# Patient Record
Sex: Female | Born: 1990 | Race: White | Hispanic: No | Marital: Married | Smoking: Former smoker
Health system: Southern US, Community
[De-identification: ages and names within clinical notes are randomized; demographics above are authoritative.]

## PROBLEM LIST (undated history)

## (undated) DIAGNOSIS — L309 Dermatitis, unspecified: Secondary | ICD-10-CM

## (undated) DIAGNOSIS — D509 Iron deficiency anemia, unspecified: Secondary | ICD-10-CM

## (undated) DIAGNOSIS — D249 Benign neoplasm of unspecified breast: Secondary | ICD-10-CM

## (undated) DIAGNOSIS — C539 Malignant neoplasm of cervix uteri, unspecified: Secondary | ICD-10-CM

## (undated) DIAGNOSIS — R002 Palpitations: Secondary | ICD-10-CM

## (undated) DIAGNOSIS — R079 Chest pain, unspecified: Secondary | ICD-10-CM

## (undated) DIAGNOSIS — Z8541 Personal history of malignant neoplasm of cervix uteri: Secondary | ICD-10-CM

## (undated) DIAGNOSIS — R1310 Dysphagia, unspecified: Secondary | ICD-10-CM

## (undated) DIAGNOSIS — N857 Hematometra: Secondary | ICD-10-CM

## (undated) DIAGNOSIS — G44209 Tension-type headache, unspecified, not intractable: Secondary | ICD-10-CM

## (undated) HISTORY — DX: Malignant neoplasm of cervix uteri, unspecified: C53.9

## (undated) HISTORY — PX: CERVICAL BIOPSY  W/ LOOP ELECTRODE EXCISION: SUR135

## (undated) HISTORY — DX: Tension-type headache, unspecified, not intractable: G44.209

## (undated) HISTORY — PX: CERVICAL CONIZATION W/BX: SHX1330

## (undated) HISTORY — DX: Benign neoplasm of unspecified breast: D24.9

## (undated) HISTORY — DX: Chest pain, unspecified: R07.9

## (undated) HISTORY — DX: Dysphagia, unspecified: R13.10

---

## 2011-02-07 HISTORY — PX: BREAST BIOPSY: SHX20

## 2018-03-20 DIAGNOSIS — R87613 High grade squamous intraepithelial lesion on cytologic smear of cervix (HGSIL): Secondary | ICD-10-CM | POA: Insufficient documentation

## 2019-02-13 DIAGNOSIS — Z87891 Personal history of nicotine dependence: Secondary | ICD-10-CM | POA: Insufficient documentation

## 2019-03-05 DIAGNOSIS — C539 Malignant neoplasm of cervix uteri, unspecified: Secondary | ICD-10-CM | POA: Insufficient documentation

## 2019-06-04 ENCOUNTER — Other Ambulatory Visit: Payer: Self-pay

## 2019-06-04 ENCOUNTER — Emergency Department (HOSPITAL_COMMUNITY)
Admission: EM | Admit: 2019-06-04 | Discharge: 2019-06-05 | Disposition: A | Discharge status: Home / Self Care | Payer: 59 | Attending: Emergency Medicine | Admitting: Emergency Medicine

## 2019-06-04 ENCOUNTER — Encounter (HOSPITAL_COMMUNITY): Payer: Self-pay | Admitting: Emergency Medicine

## 2019-06-04 DIAGNOSIS — Z9889 Other specified postprocedural states: Secondary | ICD-10-CM | POA: Insufficient documentation

## 2019-06-04 DIAGNOSIS — N939 Abnormal uterine and vaginal bleeding, unspecified: Secondary | ICD-10-CM | POA: Insufficient documentation

## 2019-06-04 DIAGNOSIS — Z79899 Other long term (current) drug therapy: Secondary | ICD-10-CM | POA: Insufficient documentation

## 2019-06-04 LAB — I-STAT CHEM 8, ED
BUN: 7 mg/dL (ref 6–20)
Calcium, Ion: 1.27 mmol/L (ref 1.15–1.40)
Chloride: 103 mmol/L (ref 98–111)
Creatinine, Ser: 0.6 mg/dL (ref 0.44–1.00)
Glucose, Bld: 78 mg/dL (ref 70–99)
HCT: 30 % — ABNORMAL LOW (ref 36.0–46.0)
Hemoglobin: 10.2 g/dL — ABNORMAL LOW (ref 12.0–15.0)
Potassium: 4 mmol/L (ref 3.5–5.1)
Sodium: 138 mmol/L (ref 135–145)
TCO2: 24 mmol/L (ref 22–32)

## 2019-06-04 LAB — CBC WITH DIFFERENTIAL/PLATELET
Abs Immature Granulocytes: 0.03 10*3/uL (ref 0.00–0.07)
Basophils Absolute: 0.1 10*3/uL (ref 0.0–0.1)
Basophils Relative: 1 %
Eosinophils Absolute: 0 10*3/uL (ref 0.0–0.5)
Eosinophils Relative: 1 %
HCT: 31 % — ABNORMAL LOW (ref 36.0–46.0)
Hemoglobin: 10.2 g/dL — ABNORMAL LOW (ref 12.0–15.0)
Immature Granulocytes: 0 %
Lymphocytes Relative: 15 %
Lymphs Abs: 1.3 10*3/uL (ref 0.7–4.0)
MCH: 29.5 pg (ref 26.0–34.0)
MCHC: 32.9 g/dL (ref 30.0–36.0)
MCV: 89.6 fL (ref 80.0–100.0)
Monocytes Absolute: 0.6 10*3/uL (ref 0.1–1.0)
Monocytes Relative: 7 %
Neutro Abs: 6.4 10*3/uL (ref 1.7–7.7)
Neutrophils Relative %: 76 %
Platelets: 133 10*3/uL — ABNORMAL LOW (ref 150–400)
RBC: 3.46 MIL/uL — ABNORMAL LOW (ref 3.87–5.11)
RDW: 13.4 % (ref 11.5–15.5)
WBC: 8.5 10*3/uL (ref 4.0–10.5)
nRBC: 0 % (ref 0.0–0.2)

## 2019-06-04 NOTE — ED Triage Notes (Signed)
Pt c/o vaginal bleeding from a surgery on cervix earlier today:2pm

## 2019-06-05 ENCOUNTER — Encounter (HOSPITAL_COMMUNITY): Payer: Self-pay | Admitting: Emergency Medicine

## 2019-06-05 LAB — WET PREP, GENITAL
Clue Cells Wet Prep HPF POC: NONE SEEN
Sperm: NONE SEEN
Trich, Wet Prep: NONE SEEN
Yeast Wet Prep HPF POC: NONE SEEN

## 2019-06-05 MED ORDER — MONSELS FERRIC SUBSULFATE EX SOLN
Freq: Once | CUTANEOUS | Status: AC
  Filled 2019-06-05: qty 8

## 2019-06-05 NOTE — ED Provider Notes (Signed)
[facility] DEPT Provider Note   CSN: [id number] Arrival date & time: 06/04/19  2026     History Chief Complaint  Patient presents with  . Vaginal Bleeding    Cassandra Watts is a 29 y.o. female.  The history is provided by the patient.  Vaginal Bleeding Quality:  Clots and dark red Severity:  Moderate Onset quality:  Gradual Duration:  10 hours Timing:  Constant Progression:  Unchanged Chronicity:  New Menstrual history:  Regular Number of pads used:  6 Possible pregnancy: no   Context: spontaneously   Context: not after intercourse   Context comment:  Post cone biopsy at [place] earlier in the day.  Told to come in by on call  Worsened by:  Nothing Ineffective treatments:  None tried Associated symptoms: no abdominal pain, no back pain, no dizziness and no fever   Risk factors: no bleeding disorder        History reviewed. No pertinent past medical history.  There are no problems to display for this patient.   Past Surgical History:  Procedure Laterality Date  . SKIN BIOPSY       OB History   No obstetric history on file.     History reviewed. No pertinent family history.  Social History   Tobacco Use  . Smoking status: Not on file  Substance Use Topics  . Alcohol use: Not on file  . Drug use: Not on file    Home Medications Prior to Admission medications   Medication Sig Start Date End Date Taking? Authorizing Provider  acetaminophen (TYLENOL) 325 MG tablet Take 650 mg by mouth every 6 (six) hours as needed for mild pain.   Yes [provider]  ferrous sulfate 325 (65 FE) MG tablet Take 325 mg by mouth daily with breakfast.    Yes [provider]  ibuprofen (ADVIL) 200 MG tablet Take 600-800 mg by mouth every 6 (six) hours as needed for moderate pain.   Yes [provider]    Allergies    Oxycodone and Penicillins  Review of Systems   Review of Systems  Constitutional: Negative for  fever.  HENT: Negative for congestion.   Eyes: Negative for visual disturbance.  Respiratory: Negative for cough.   Gastrointestinal: Negative for abdominal pain.  Genitourinary: Positive for vaginal bleeding.  Musculoskeletal: Negative for back pain.  Neurological: Negative for dizziness.  Psychiatric/Behavioral: Negative for agitation.  All other systems reviewed and are negative.   Physical Exam Updated Vital Signs BP 114/72 (BP Location: Right Arm)   Pulse 80   Temp 98.8 F (37.1 C) (Oral)   Resp 17   Ht 5\' 6"  (1.676 m)   Wt 63.5 kg   SpO2 98%   BMI 22.60 kg/m   Physical Exam Vitals and nursing note reviewed.  Constitutional:      Appearance: Normal appearance.  HENT:     Head: Normocephalic and atraumatic.     Nose: Nose normal.  Eyes:     Conjunctiva/sclera: Conjunctivae normal.     Pupils: Pupils are equal, round, and reactive to light.  Cardiovascular:     Rate and Rhythm: Normal rate and regular rhythm.     Pulses: Normal pulses.     Heart sounds: Normal heart sounds.  Pulmonary:     Effort: Pulmonary effort is normal.     Breath sounds: Normal breath sounds.  Abdominal:     General: Abdomen is flat. Bowel sounds are normal.     Tenderness:  There is no abdominal tenderness. There is no guarding or rebound.  Genitourinary:    General: Normal vulva.     Comments: Clots and scant bleeding in the introitus on exam, chaperone present  Musculoskeletal:        General: Normal range of motion.     Cervical back: Normal range of motion and neck supple.  Skin:    General: Skin is warm and dry.     Capillary Refill: Capillary refill takes less than 2 seconds.  Neurological:     General: No focal deficit present.     Mental Status: She is alert and oriented to person, place, and time.  Psychiatric:        Mood and Affect: Mood normal.        Behavior: Behavior normal.     ED Results / Procedures / Treatments   Labs (all labs ordered are listed, but only  abnormal results are displayed) Results for orders placed or performed during the hospital encounter of 06/04/19  Wet prep, genital  Result Value Ref Range   Yeast Wet Prep HPF POC NONE SEEN NONE SEEN   Trich, Wet Prep NONE SEEN NONE SEEN   Clue Cells Wet Prep HPF POC NONE SEEN NONE SEEN   WBC, Wet Prep HPF POC MODERATE (A) NONE SEEN   Sperm NONE SEEN   CBC with Differential/Platelet  Result Value Ref Range   WBC 8.5 4.0 - 10.5 K/uL   RBC 3.46 (L) 3.87 - 5.11 MIL/uL   Hemoglobin 10.2 (L) 12.0 - 15.0 g/dL   HCT 31.0 (L) 36.0 - 46.0 %   MCV 89.6 80.0 - 100.0 fL   MCH 29.5 26.0 - 34.0 pg   MCHC 32.9 30.0 - 36.0 g/dL   RDW 13.4 11.5 - 15.5 %   Platelets 133 (L) 150 - 400 K/uL   nRBC 0.0 0.0 - 0.2 %   Neutrophils Relative % 76 %   Neutro Abs 6.4 1.7 - 7.7 K/uL   Lymphocytes Relative 15 %   Lymphs Abs 1.3 0.7 - 4.0 K/uL   Monocytes Relative 7 %   Monocytes Absolute 0.6 0.1 - 1.0 K/uL   Eosinophils Relative 1 %   Eosinophils Absolute 0.0 0.0 - 0.5 K/uL   Basophils Relative 1 %   Basophils Absolute 0.1 0.0 - 0.1 K/uL   Immature Granulocytes 0 %   Abs Immature Granulocytes 0.03 0.00 - 0.07 K/uL  I-stat chem 8, ED (not at [facility] or [facility])  Result Value Ref Range   Sodium 138 135 - 145 mmol/L   Potassium 4.0 3.5 - 5.1 mmol/L   Chloride 103 98 - 111 mmol/L   BUN 7 6 - 20 mg/dL   Creatinine, Ser 0.60 0.44 - 1.00 mg/dL   Glucose, Bld 78 70 - 99 mg/dL   Calcium, Ion 1.27 1.15 - 1.40 mmol/L   TCO2 24 22 - 32 mmol/L   Hemoglobin 10.2 (L) 12.0 - 15.0 g/dL   HCT 30.0 (L) 36.0 - 46.0 %   No results found.  Radiology No results found.  Procedures Procedures (including critical care time)  Medications Ordered in ED Medications - No data to display  ED Course  I have reviewed the triage vital signs and the nursing notes.  Pertinent labs & imaging results that were available during my care of the patient were reviewed by me and considered in my medical decision making (see chart for  details).   Monsel solution obtained from pharmacy and painted  on cervix with chaperone present to stop bleeding .   Case d/w Dr. Jacky Kindle on call for Dr. Marlaine Hind patient's surgeon.  Will need to be seen there or transferred to [facility] main if cannot stop bleeding.    125 Case d/w Dr. Sherlie Ban solution to stop bleeding, wait and ambulate patient if not bleeding may be discharged and follow up tomorrow with GYN of record.      Patient ambulated without rebleeding.    Patient informed of coffee ground discharge following mosel solution and need to follow up today with her GYN.  Patient verbalizes understanding and agrees to follow up.  Final Clinical Impression(s) / ED Diagnoses Return for weakness, numbness, changes in vision or speech, fevers >100.4 unrelieved by medication, shortness of breath, intractable vomiting, or diarrhea, abdominal pain, Inability to tolerate liquids or food, cough, altered mental status or any concerns. No signs of systemic illness or infection. The patient is nontoxic-appearing on exam and vital signs are within normal limits.   I have reviewed the triage vital signs and the nursing notes. Pertinent labs &imaging results that were available during my care of the patient were reviewed by me and considered in my medical decision making (see chart for details).  After history, exam, and medical workup I feel the patient has been appropriately medically screened and is safe for discharge home. Pertinent diagnoses were discussed with the patient. Patient was givenstrictreturn precautions.   Lisabeth Mian, MD 06/05/19 [id number]

## 2019-06-05 NOTE — Discharge Instructions (Signed)
foll

## 2019-07-16 ENCOUNTER — Ambulatory Visit: Payer: No Typology Code available for payment source | Admitting: Cardiology

## 2019-07-17 ENCOUNTER — Encounter: Payer: Self-pay | Admitting: *Deleted

## 2019-07-17 ENCOUNTER — Ambulatory Visit (INDEPENDENT_AMBULATORY_CARE_PROVIDER_SITE_OTHER): Payer: No Typology Code available for payment source | Admitting: Cardiology

## 2019-07-17 ENCOUNTER — Other Ambulatory Visit: Payer: Self-pay

## 2019-07-17 VITALS — BP 125/70 | HR 61 | Temp 97.3°F | Ht 66.0 in | Wt 144.8 lb

## 2019-07-17 DIAGNOSIS — R079 Chest pain, unspecified: Secondary | ICD-10-CM

## 2019-07-17 DIAGNOSIS — R002 Palpitations: Secondary | ICD-10-CM

## 2019-07-17 NOTE — Progress Notes (Signed)
Cardiology Office Note:    Date:  07/17/2019   ID:  Cassandra Watts, DOB May 25, 1990, MRN [id number]  PCP:  Caren Macadam, MD  Cardiologist:  No primary care provider on file.  Electrophysiologist:  None   Referring MD: Caren Macadam, MD   Chief Complaint  Patient presents with  . Palpitations    History of Present Illness:    Cassandra Watts is a 29 y.o. female with a hx of cervical cancer, former tobacco use who is referred by Dr. Mannie Stabile for evaluation of palpitations.  She reports that in April she woke up in the middle of the night with palpitations.  Described as feeling that heart rate was going slow than fast.  Felt lightheaded.  Episode lasted about 30 minutes.  She went to sleep but felt very fatigued the following day.  She then had a subsequent episode the next day where he woke her up from sleep.  Since April has had 2 episodes of this.  Walks every day for 30 minutes.  Reports some lightheadedness with walking.  States that she has had chest pain since last week.  Reports left shoulder and left chest pain.  Pain has been constant.  Described as dull aching pain that is worse with deep breathing.  Denies any cough or fever.  Has not noted worsening with exertion.  She smoked 1 pack/day x 1 year, quit in 2012.  Family history includes mother had arrhythmia (unclear diagnosis).  Labs on 5/24 showed normal thyroid, creatinine, electrolytes. Hgb 11.3.   No past medical history on file.  Past Surgical History:  Procedure Laterality Date  . SKIN BIOPSY      Current Medications: Current Meds  Medication Sig  . ferrous sulfate 325 (65 FE) MG tablet Take 325 mg by mouth daily with breakfast.      Allergies:   Oxycodone and Penicillins   Social History   Socioeconomic History  . Marital status: Married    Spouse name: Not on file  . Number of children: Not on file  . Years of education: Not on file  . Highest education level: Not on file  Occupational History  . Not on file    Tobacco Use  . Smoking status: Not on file  Substance and Sexual Activity  . Alcohol use: Not on file  . Drug use: Not on file  . Sexual activity: Not on file  Other Topics Concern  . Not on file  Social History Narrative  . Not on file   Social Determinants of Health   Financial Resource Strain:   . Difficulty of Paying Living Expenses:   Food Insecurity:   . Worried About Charity fundraiser in the Last Year:   . Arboriculturist in the Last Year:   Transportation Needs:   . Film/video editor (Medical):   Marland Kitchen Lack of Transportation (Non-Medical):   Physical Activity:   . Days of Exercise per Week:   . Minutes of Exercise per Session:   Stress:   . Feeling of Stress :   Social Connections:   . Frequency of Communication with Friends and Family:   . Frequency of Social Gatherings with Friends and Family:   . Attends Religious Services:   . Active Member of Clubs or Organizations:   . Attends Archivist Meetings:   Marland Kitchen Marital Status:      Family History: Mother has history of arrhythmia (unclear diagnosis).  ROS:   Please see the history  of present illness.     All other systems reviewed and are negative.  EKGs/Labs/Other Studies Reviewed:    The following studies were reviewed today:   EKG:  EKG is ordered today.  The ekg ordered today demonstrates normal sinus rhythm, rate 61, diffuse subtle concave ST elevations  Recent Labs: 06/04/2019: BUN 7; Creatinine, Ser 0.60; Hemoglobin 10.2; Platelets 133; Potassium 4.0; Sodium 138  Recent Lipid Panel No results found for: CHOL, TRIG, HDL, CHOLHDL, VLDL, LDLCALC, LDLDIRECT  Physical Exam:    VS:  BP 125/70   Pulse 61   Temp (!) 97.3 F (36.3 C)   Ht '5\' 6"'$  (1.676 m)   Wt 144 lb 12.8 oz (65.7 kg)   SpO2 99%   BMI 23.37 kg/m     Wt Readings from Last 3 Encounters:  07/17/19 144 lb 12.8 oz (65.7 kg)  06/04/19 140 lb (63.5 kg)     GEN:  Well nourished, well developed in no acute distress HEENT:  Normal NECK: No JVD; No carotid bruits LYMPHATICS: No lymphadenopathy CARDIAC: RRR, no murmurs, rubs, gallops RESPIRATORY:  Clear to auscultation without rales, wheezing or rhonchi  ABDOMEN: Soft, non-tender, non-distended MUSCULOSKELETAL:  No edema; No deformity  SKIN: Warm and dry NEUROLOGIC:  Alert and oriented x 3 PSYCHIATRIC:  Normal affect   ASSESSMENT:    1. Chest pain of uncertain etiology   2. Palpitations    PLAN:    Palpitations: Description concerning for arrhythmia.  Will check Zio patch x14 days.  Will check TTE to rule out structural heart disease  Chest pain: Constant pain on left side of chest.  Given pleuritic component, will check chest x-ray.  Does have concave subtle ST elevations on EKG which could suggest pericarditis (versus early repolarization).  Will check ESR/CRP.  RTC in 2 months   Medication Adjustments/Labs and Tests Ordered: Current medicines are reviewed at length with the patient today.  Concerns regarding medicines are outlined above.  Orders Placed This Encounter  Procedures  . DG Chest 2 View  . C-reactive protein  . Sedimentation rate  . LONG TERM MONITOR (3-14 DAYS)  . EKG 12-Lead  . ECHOCARDIOGRAM COMPLETE   No orders of the defined types were placed in this encounter.   Patient Instructions  Medication Instructions:  Your physician recommends that you continue on your current medications as directed. Please refer to the Current Medication list given to you today.  *If you need a refill on your cardiac medications before your next appointment, please call your pharmacy*   Lab Work: CRP, ESR  If you have labs (blood work) drawn today and your tests are completely normal, you will receive your results only by: Marland Kitchen MyChart Message (if you have MyChart) OR . A paper copy in the mail If you have any lab test that is abnormal or we need to change your treatment, we will call you to review the results.   Testing/Procedures: Your  physician has requested that you have an echocardiogram. Echocardiography is a painless test that uses sound waves to create images of your heart. It provides your doctor with information about the size and shape of your heart and how well your heart's chambers and valves are working. This procedure takes approximately one hour. There are no restrictions for this procedure. This will be done at our [facility] location:  [address]  A chest x-ray takes a picture of the organs and structures inside the chest, including the heart, lungs,  and blood vessels. This test can show several things, including, whether the heart is enlarges; whether fluid is building up in the lungs; and whether pacemaker / defibrillator leads are still in place.   ZIO XT- Long Term Monitor Instructions   Your physician has requested you wear your ZIO patch monitor 14 days.   This is a single patch monitor.  [employer] supplies one patch monitor per enrollment.  Additional stickers are not available.   Please do not apply patch if you will be having a Nuclear Stress Test, Echocardiogram, Cardiac CT, MRI, or Chest Xray during the time frame you would be wearing the monitor. The patch cannot be worn during these tests.  You cannot remove and re-apply the ZIO XT patch monitor.   Your ZIO patch monitor will be sent USPS Priority mail from [facility] directly to your home address. The monitor may also be mailed to a PO BOX if home delivery is not available.   It may take 3-5 days to receive your monitor after you have been enrolled.   Once you have received you monitor, please review enclosed instructions.  Your monitor has already been registered assigning a specific monitor serial # to you.   Applying the monitor   Shave hair from upper left chest.   Hold abrader disc by orange tab.  Rub abrader in 40 strokes over left upper chest as indicated in your monitor instructions.   Clean area with 4  enclosed alcohol pads .  Use all pads to assure are is cleaned thoroughly.  Let dry.   Apply patch as indicated in monitor instructions.  Patch will be place under collarbone on left side of chest with arrow pointing upward.   Rub patch adhesive wings for 2 minutes.Remove white label marked "1".  Remove white label marked "2".  Rub patch adhesive wings for 2 additional minutes.   While looking in a mirror, press and release button in center of patch.  A small green light will flash 3-4 times .  This will be your only indicator the monitor has been turned on.     Do not shower for the first 24 hours.  You may shower after the first 24 hours.   Press button if you feel a symptom. You will hear a small click.  Record Date, Time and Symptom in the Patient Log Book.   When you are ready to remove patch, follow instructions on last 2 pages of Patient Log Book.  Stick patch monitor onto last page of Patient Log Book.   Place Patient Log Book in [place] box.  Use locking tab on box and tape box closed securely.  The [employer] has [employer] on it.  Please place in mailbox as soon as possible.  Your physician should have your test results approximately 7 days after the monitor has been mailed back to [facility].   Call [place] at [phone] if you have questions regarding your ZIO XT patch monitor.  Call them immediately if you see an orange light blinking on your monitor.   If your monitor falls off in less than 4 days contact our Monitor department at [phone].  If your monitor becomes loose or falls off after 4 days call [employer] at [phone] for suggestions on securing your monitor.      Follow-Up: At [facility], you and your health needs are our priority.  As part of our continuing mission to provide you with exceptional heart  care, we have created designated Provider Care Teams.  These Care Teams include your primary Cardiologist (physician)  and Advanced Practice Providers (APPs -  Physician Assistants and Nurse Practitioners) who all work together to provide you with the care you need, when you need it.  We recommend signing up for the patient portal called "MyChart".  Sign up information is provided on this After Visit Summary.  MyChart is used to connect with patients for Virtual Visits (Telemedicine).  Patients are able to view lab/test results, encounter notes, upcoming appointments, etc.  Non-urgent messages can be sent to your provider as well.   To learn more about what you can do with MyChart, go to [email or web address].    Your next appointment:   2 month(s)  The format for your next appointment:   In Person  Provider:   Oswaldo Milian, MD         Signed, Donato Heinz, MD  07/17/2019 5:57 PM    [place]

## 2019-07-17 NOTE — Patient Instructions (Signed)
Medication Instructions:  Your physician recommends that you continue on your current medications as directed. Please refer to the Current Medication list given to you today.  *If you need a refill on your cardiac medications before your next appointment, please call your pharmacy*   Lab Work: CRP, ESR  If you have labs (blood work) drawn today and your tests are completely normal, you will receive your results only by: Marland Kitchen MyChart Message (if you have MyChart) OR . A paper copy in the mail If you have any lab test that is abnormal or we need to change your treatment, we will call you to review the results.   Testing/Procedures: Your physician has requested that you have an echocardiogram. Echocardiography is a painless test that uses sound waves to create images of your heart. It provides your doctor with information about the size and shape of your heart and how well your heart's chambers and valves are working. This procedure takes approximately one hour. There are no restrictions for this procedure. This will be done at our [facility] location:  [address]  A chest x-ray takes a picture of the organs and structures inside the chest, including the heart, lungs, and blood vessels. This test can show several things, including, whether the heart is enlarges; whether fluid is building up in the lungs; and whether pacemaker / defibrillator leads are still in place.   ZIO XT- Long Term Monitor Instructions   Your physician has requested you wear your ZIO patch monitor 14 days.   This is a single patch monitor.  [employer] supplies one patch monitor per enrollment.  Additional stickers are not available.   Please do not apply patch if you will be having a Nuclear Stress Test, Echocardiogram, Cardiac CT, MRI, or Chest Xray during the time frame you would be wearing the monitor. The patch cannot be worn during these tests.  You cannot remove and re-apply the ZIO XT patch  monitor.   Your ZIO patch monitor will be sent USPS Priority mail from [facility] directly to your home address. The monitor may also be mailed to a PO BOX if home delivery is not available.   It may take 3-5 days to receive your monitor after you have been enrolled.   Once you have received you monitor, please review enclosed instructions.  Your monitor has already been registered assigning a specific monitor serial # to you.   Applying the monitor   Shave hair from upper left chest.   Hold abrader disc by orange tab.  Rub abrader in 40 strokes over left upper chest as indicated in your monitor instructions.   Clean area with 4 enclosed alcohol pads .  Use all pads to assure are is cleaned thoroughly.  Let dry.   Apply patch as indicated in monitor instructions.  Patch will be place under collarbone on left side of chest with arrow pointing upward.   Rub patch adhesive wings for 2 minutes.Remove white label marked "1".  Remove white label marked "2".  Rub patch adhesive wings for 2 additional minutes.   While looking in a mirror, press and release button in center of patch.  A small green light will flash 3-4 times .  This will be your only indicator the monitor has been turned on.     Do not shower for the first 24 hours.  You may shower after the first 24 hours.   Press button if you feel a symptom.  You will hear a small click.  Record Date, Time and Symptom in the Patient Log Book.   When you are ready to remove patch, follow instructions on last 2 pages of Patient Log Book.  Stick patch monitor onto last page of Patient Log Book.   Place Patient Log Book in [place] box.  Use locking tab on box and tape box closed securely.  The [employer] has [employer] on it.  Please place in mailbox as soon as possible.  Your physician should have your test results approximately 7 days after the monitor has been mailed back to [facility].   Call [place]  at [phone] if you have questions regarding your ZIO XT patch monitor.  Call them immediately if you see an orange light blinking on your monitor.   If your monitor falls off in less than 4 days contact our Monitor department at [phone].  If your monitor becomes loose or falls off after 4 days call [employer] at [phone] for suggestions on securing your monitor.      Follow-Up: At [facility], you and your health needs are our priority.  As part of our continuing mission to provide you with exceptional heart care, we have created designated Provider Care Teams.  These Care Teams include your primary Cardiologist (physician) and Advanced Practice Providers (APPs -  Physician Assistants and Nurse Practitioners) who all work together to provide you with the care you need, when you need it.  We recommend signing up for the patient portal called "MyChart".  Sign up information is provided on this After Visit Summary.  MyChart is used to connect with patients for Virtual Visits (Telemedicine).  Patients are able to view lab/test results, encounter notes, upcoming appointments, etc.  Non-urgent messages can be sent to your provider as well.   To learn more about what you can do with MyChart, go to [email or web address].    Your next appointment:   2 month(s)  The format for your next appointment:   In Person  Provider:   Oswaldo Milian, MD

## 2019-07-17 NOTE — Progress Notes (Signed)
Patient ID: Cassandra Watts, female   DOB: 21-Aug-1990, 29 y.o.   MRN: [id number] Patient enrolled for [employer] to ship a 14 day ZIO XT long term holter monitor to the patients home.

## 2019-07-18 ENCOUNTER — Telehealth: Payer: Self-pay | Admitting: Cardiology

## 2019-07-18 NOTE — Telephone Encounter (Signed)
Left message for patient to call and schedule Echo and 2 month follow up with Dr. Gardiner Rhyme.

## 2019-07-23 ENCOUNTER — Ambulatory Visit (INDEPENDENT_AMBULATORY_CARE_PROVIDER_SITE_OTHER): Payer: No Typology Code available for payment source

## 2019-07-23 DIAGNOSIS — R002 Palpitations: Secondary | ICD-10-CM

## 2019-08-12 ENCOUNTER — Ambulatory Visit
Admission: RE | Admit: 2019-08-12 | Discharge: 2019-08-12 | Disposition: A | Discharge status: Home / Self Care | Payer: No Typology Code available for payment source | Source: Ambulatory Visit | Attending: Cardiology | Admitting: Cardiology

## 2019-08-12 DIAGNOSIS — R079 Chest pain, unspecified: Secondary | ICD-10-CM

## 2019-08-13 LAB — SEDIMENTATION RATE: Sed Rate: 8 mm/hr (ref 0–32)

## 2019-08-13 LAB — C-REACTIVE PROTEIN: CRP: 1 mg/L (ref 0–10)

## 2019-08-14 ENCOUNTER — Ambulatory Visit (HOSPITAL_COMMUNITY): Payer: No Typology Code available for payment source | Attending: Cardiology

## 2019-08-14 ENCOUNTER — Other Ambulatory Visit: Payer: Self-pay

## 2019-08-14 DIAGNOSIS — R002 Palpitations: Secondary | ICD-10-CM | POA: Insufficient documentation

## 2019-08-14 DIAGNOSIS — R079 Chest pain, unspecified: Secondary | ICD-10-CM | POA: Diagnosis present

## 2019-08-20 ENCOUNTER — Emergency Department (HOSPITAL_BASED_OUTPATIENT_CLINIC_OR_DEPARTMENT_OTHER): Payer: No Typology Code available for payment source

## 2019-08-20 ENCOUNTER — Encounter (HOSPITAL_BASED_OUTPATIENT_CLINIC_OR_DEPARTMENT_OTHER): Payer: Self-pay

## 2019-08-20 ENCOUNTER — Emergency Department (HOSPITAL_BASED_OUTPATIENT_CLINIC_OR_DEPARTMENT_OTHER)
Admission: EM | Admit: 2019-08-20 | Discharge: 2019-08-20 | Disposition: A | Discharge status: Home / Self Care | Payer: No Typology Code available for payment source | Attending: Emergency Medicine | Admitting: Emergency Medicine

## 2019-08-20 ENCOUNTER — Other Ambulatory Visit: Payer: Self-pay

## 2019-08-20 DIAGNOSIS — N857 Hematometra: Secondary | ICD-10-CM

## 2019-08-20 DIAGNOSIS — R102 Pelvic and perineal pain: Secondary | ICD-10-CM | POA: Insufficient documentation

## 2019-08-20 LAB — URINALYSIS, MICROSCOPIC (REFLEX): RBC / HPF: NONE SEEN RBC/hpf (ref 0–5)

## 2019-08-20 LAB — CBC
HCT: 36.7 % (ref 36.0–46.0)
Hemoglobin: 12.2 g/dL (ref 12.0–15.0)
MCH: 28.9 pg (ref 26.0–34.0)
MCHC: 33.2 g/dL (ref 30.0–36.0)
MCV: 87 fL (ref 80.0–100.0)
Platelets: 185 10*3/uL (ref 150–400)
RBC: 4.22 MIL/uL (ref 3.87–5.11)
RDW: 13.5 % (ref 11.5–15.5)
WBC: 7.1 10*3/uL (ref 4.0–10.5)
nRBC: 0 % (ref 0.0–0.2)

## 2019-08-20 LAB — COMPREHENSIVE METABOLIC PANEL
ALT: 11 U/L (ref 0–44)
AST: 13 U/L — ABNORMAL LOW (ref 15–41)
Albumin: 4.5 g/dL (ref 3.5–5.0)
Alkaline Phosphatase: 49 U/L (ref 38–126)
Anion gap: 9 (ref 5–15)
BUN: 9 mg/dL (ref 6–20)
CO2: 24 mmol/L (ref 22–32)
Calcium: 9.6 mg/dL (ref 8.9–10.3)
Chloride: 105 mmol/L (ref 98–111)
Creatinine, Ser: 0.55 mg/dL (ref 0.44–1.00)
GFR calc Af Amer: >60 mL/min (ref >60)
GFR calc non Af Amer: >60 mL/min (ref >60)
Glucose, Bld: 100 mg/dL — ABNORMAL HIGH (ref 70–99)
Potassium: 4 mmol/L (ref 3.5–5.1)
Sodium: 138 mmol/L (ref 135–145)
Total Bilirubin: 0.3 mg/dL (ref 0.3–1.2)
Total Protein: 7.8 g/dL (ref 6.5–8.1)

## 2019-08-20 LAB — URINALYSIS, ROUTINE W REFLEX MICROSCOPIC
Bilirubin Urine: NEGATIVE
Glucose, UA: NEGATIVE mg/dL
Hgb urine dipstick: NEGATIVE
Ketones, ur: NEGATIVE mg/dL
Nitrite: NEGATIVE
Protein, ur: NEGATIVE mg/dL
Specific Gravity, Urine: <1.005 — ABNORMAL LOW (ref 1.005–1.030)
pH: 6 (ref 5.0–8.0)

## 2019-08-20 LAB — PREGNANCY, URINE: Preg Test, Ur: NEGATIVE

## 2019-08-20 LAB — LIPASE, BLOOD: Lipase: 31 U/L (ref 11–51)

## 2019-08-20 MED ORDER — SODIUM CHLORIDE 0.9% FLUSH
3.0000 mL | Freq: Once | INTRAVENOUS | Status: DC
  Filled 2019-08-20: qty 3

## 2019-08-20 MED ORDER — HYDROCODONE-ACETAMINOPHEN 5-325 MG PO TABS: 1.0000 | ORAL_TABLET | Freq: Four times a day (QID) | ORAL | 0 refills | Status: DC | PRN

## 2019-08-20 NOTE — ED Provider Notes (Signed)
[facility] EMERGENCY DEPARTMENT Provider Note   CSN: [id number] Arrival date & time: 08/20/19  1725     History Chief Complaint  Patient presents with  . Abdominal Pain    Cassandra Watts is a 29 y.o. female.  Patient is a 29 year old female with no significant past medical history.  She presents today for evaluation of pain in her back and pelvis.  This has been ongoing for the past several days, however worsened yesterday.  She denies any fevers or chills.  She denies any urinary complaints.  She denies any bowel complaints.  Pain is worse when she walks.  Patient reports not having a menstrual period since March.  She has seen her primary doctor and GYN, however no reason for this has been identified.  She spoke with her GYN today who instructed her to come to the ER to see about having an ultrasound performed.  The history is provided by the patient.  Abdominal Pain Pain location:  Suprapubic Pain quality: cramping   Pain radiates to:  L flank and R flank Pain severity:  Severe Onset quality:  Sudden Duration:  2 days Timing:  Intermittent Progression:  Worsening Chronicity:  New      History reviewed. No pertinent past medical history.  There are no problems to display for this patient.   Past Surgical History:  Procedure Laterality Date  . SKIN BIOPSY       OB History   No obstetric history on file.     No family history on file.  Social History   Tobacco Use  . Smoking status: Never Smoker  . Smokeless tobacco: Never Used  Vaping Use  . Vaping Use: Never used  Substance Use Topics  . Alcohol use: Never  . Drug use: Never    Home Medications Prior to Admission medications   Medication Sig Start Date End Date Taking? Authorizing Provider  ferrous sulfate 325 (65 FE) MG tablet Take 325 mg by mouth daily with breakfast.     [provider]    Allergies    Oxycodone and Penicillins  Review of Systems   Review of Systems    Gastrointestinal: Positive for abdominal pain.  All other systems reviewed and are negative.   Physical Exam Updated Vital Signs BP (!) 148/98 (BP Location: Left Arm)   Pulse 72   Temp 98.3 F (36.8 C) (Oral)   Resp 18   Ht 5\' 6"  (1.676 m)   Wt 64.9 kg   SpO2 100%   BMI 23.08 kg/m   Physical Exam Vitals and nursing note reviewed.  Constitutional:      General: She is not in acute distress.    Appearance: She is well-developed. She is not diaphoretic.  HENT:     Head: Normocephalic and atraumatic.  Cardiovascular:     Rate and Rhythm: Normal rate and regular rhythm.     Heart sounds: No murmur heard.  No friction rub. No gallop.   Pulmonary:     Effort: Pulmonary effort is normal. No respiratory distress.     Breath sounds: Normal breath sounds. No wheezing.  Abdominal:     General: Bowel sounds are normal. There is no distension.     Palpations: Abdomen is soft.     Tenderness: There is abdominal tenderness in the suprapubic area. There is no right CVA tenderness, left CVA tenderness, guarding or rebound.  Musculoskeletal:        General: Normal range of motion.  Cervical back: Normal range of motion and neck supple.  Skin:    General: Skin is warm and dry.  Neurological:     Mental Status: She is alert and oriented to person, place, and time.     ED Results / Procedures / Treatments   Labs (all labs ordered are listed, but only abnormal results are displayed) Labs Reviewed  COMPREHENSIVE METABOLIC PANEL - Abnormal; Notable for the following components:      Result Value   Glucose, Bld 100 (*)    AST 13 (*)    All other components within normal limits  URINALYSIS, ROUTINE W REFLEX MICROSCOPIC - Abnormal; Notable for the following components:   Specific Gravity, Urine <1.005 (*)    Leukocytes,Ua SMALL (*)    All other components within normal limits  URINALYSIS, MICROSCOPIC (REFLEX) - Abnormal; Notable for the following components:   Bacteria, UA FEW (*)     All other components within normal limits  LIPASE, BLOOD  CBC  PREGNANCY, URINE    EKG None  Radiology No results found.  Procedures Procedures (including critical care time)  Medications Ordered in ED Medications  sodium chloride flush (NS) 0.9 % injection 3 mL (3 mLs Intravenous Not Given 08/20/19 1812)    ED Course  I have reviewed the triage vital signs and the nursing notes.  Pertinent labs & imaging results that were available during my care of the patient were reviewed by me and considered in my medical decision making (see chart for details).    MDM Rules/Calculators/A&P  Patient is a 29 year old female presenting with suprapubic cramping radiating into her back.  She describes this as a feeling like labor pains.  Patient had a cone biopsy done in April and has not had a period since.  Patient's ultrasound today reveals a dilated endometrial canal extending into the lower uterine segment and cervix with heterogeneous hyperechoic debris and blood products.  Care discussed with Dr. Kennon Rounds from GYN.  She is recommending follow-up with the patient's GYN in the next few days.  It appears as though she has a hematometra related to her cone biopsy and is unable to have her period.  Final Clinical Impression(s) / ED Diagnoses Final diagnoses:  Pelvic pain    Rx / DC Orders ED Discharge Orders    None       Veryl Speak, MD 08/20/19 2219

## 2019-08-20 NOTE — Discharge Instructions (Addendum)
Call your gynecologist in the morning to arrange a follow-up appointment.  Take hydrocodone as prescribed as needed pain.

## 2019-08-20 NOTE — ED Notes (Signed)
Patient transported to Ultrasound 

## 2019-08-20 NOTE — ED Triage Notes (Signed)
Pt c/o lower abd/lower back pain, nausea-sx started yesterday-NAD-steady gait

## 2019-08-21 NOTE — Progress Notes (Signed)
GYNECOLOGIC ONCOLOGY NEW PATIENT CONSULTATION   Patient Name: Cassandra Watts  Patient Age: 29 y.o. Date of Service: 08/22/19 Referring Provider: Dr. 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  We reviewed findings of her ultrasound and discussed stenosis as cause of her amenorrhea as well as her thickened endometrial lining and pain.  I offered an attempt at cervical dilation here in the clinic versus in the operating room next week under light anesthesia.  The patient was amenable to an attempt here in clinic.  Unfortunately, I was not able to cannulate the cervix and ultimately the patient's desire was to postpone any further attempts until this could be done in the operating room.  We discussed doing the procedure next Wednesday. I will call the patient next week once I have the pathology results from the polypoid lesion removed.  A copy of this note was sent to the patient's referring provider.   50 minutes of total time was spent for this patient encounter, including preparation, face-to-face counseling with the patient and coordination of care, and documentation of the encounter.  Jeral Pinch, MD  Division of Gynecologic Oncology  Department of Obstetrics and Gynecology  [facility]  ___________________________________________  Chief Complaint: Chief Complaint  Patient presents with  . Hematometra    New Patient  . Malignant neoplasm of cervix, unspecified site [facility])    History of Present Illness:  Cassandra Watts is a 29 y.o. y.o. female who is seen in consultation at the request of Dr. Marlaine Hind for an evaluation of  hematometra in the setting of cervical conization earlier this year for diagnosis of IA2 squamous cell carcinoma of the cervix.  The patient presents today after being seen in the emergency department here on Wednesday secondary to abdominal and pelvic pain/cramping.  Pelvic ultrasound performed at that ED visit showed significantly dilated endometrial cavity with hematometra.  Her history is notable for early stage cervical cancer treated with a LEEP followed by a cold knife cone completed in late April of this year.  She was counseled on recommendation to proceed with pelvic lymph node assessment when her cold knife cone resulted showing no residual disease but opted for close surveillance without surgical lymph node assessment.  Her last menses was in March.  She denies any vaginal bleeding or menstrual bleeding since surgery.  After April, she had several episodes of light pelvic cramping as if she were going to get her menses, but she never did.  Wednesday afternoon of this week, she began having significant abdominal pain and cramping.  She also notes several weeks of intermittent nausea (seems related to the quantity of food that she tries to eat) as well as early satiety.  She endorses normal bowel and bladder function and denies any fevers or chills.  PAST MEDICAL HISTORY:  Past Medical History:  Diagnosis Date  . Cervical cancer ([place])      PAST SURGICAL HISTORY:  Past Surgical History:  Procedure Laterality Date  . BREAST BIOPSY     growth  . CERVICAL CONE BIOPSY  2021  . SKIN BIOPSY      OB/GYN HISTORY:  OB History  Gravida Para Term Preterm AB Living  2  2          SAB TAB Ectopic Multiple Live Births               # Outcome Date GA Lbr Len/2nd Weight Sex Delivery Anes PTL Lv  2 Para           1 Para             No LMP recorded.  Age at menarche: 70  Age at menopause: n/a Hx of HRT: n/a Last pap: 2021 History of abnormal pap smears: yes, see  HPI  MEDICATIONS: Outpatient Encounter Medications as of 08/22/2019  Medication Sig  . ferrous sulfate 325 (65 FE) MG tablet Take 325 mg by mouth daily with breakfast.   . [DISCONTINUED] HYDROcodone-acetaminophen (NORCO) 5-325 MG tablet Take 1-2 tablets by mouth every 6 (six) hours as needed. (Patient not taking: Reported on 08/22/2019)   No facility-administered encounter medications on file as of 08/22/2019.    ALLERGIES:  Allergies  Allergen Reactions  . Oxycodone Shortness Of Breath  . Penicillins Rash     FAMILY HISTORY:  Family History  Problem Relation Age of Onset  . Cancer Mother        possible uterine cancer (abnormal cells)  . Cancer Sister        abnormal cells  . Cancer Maternal Aunt        breast     SOCIAL HISTORY:    Social Connections:   . Frequency of Communication with Friends and Family:   . Frequency of Social Gatherings with Friends and Family:   . Attends Religious Services:   . Active Member of Clubs or Organizations:   . Attends Archivist Meetings:   Marland Kitchen Marital Status:     REVIEW OF SYSTEMS:  + Intermittent nausea, early satiety, abdominal/pelvic pain, headaches. Denies appetite changes, fevers, chills, fatigue, unexplained weight changes. Denies hearing loss, neck lumps or masses, mouth sores, ringing in ears or voice changes. Denies cough or wheezing.  Denies shortness of breath. Denies chest pain or palpitations. Denies leg swelling. Denies abdominal distention, blood in stools, constipation, diarrhea, vomiting. Denies pain with intercourse, dysuria, frequency, hematuria or incontinence. Denies hot flashes, vaginal bleeding or vaginal discharge.   Denies joint pain, back pain or muscle pain/cramps. Denies itching, rash, or wounds. Denies dizziness, numbness or seizures. Denies swollen lymph nodes or glands, denies easy bruising or bleeding. Denies anxiety, depression, confusion, or decreased concentration.  Physical Exam:   Vital Signs for this encounter:  Blood pressure 104/72, pulse 68, temperature 98.2 F (36.8 C), temperature source Oral, resp. rate 16, height 5\' 6"  (1.676 m), weight 142 lb (64.4 kg), SpO2 100 %. Body mass index is 22.92 kg/m. General: Alert, oriented, no acute distress.  HEENT: Normocephalic, atraumatic. Sclera anicteric.  Chest: Clear to auscultation bilaterally. No wheezes, rhonchi, or rales. Cardiovascular: Regular rate and rhythm, no murmurs, rubs, or gallops.  Abdomen: Normoactive bowel sounds. Soft, nondistended, mildly tender to palpation in lower mid-abdomen. No masses or hepatosplenomegaly appreciated. No palpable fluid wave.  Extremities: Grossly normal range of motion. Warm, well perfused. No edema bilaterally.  Skin: No rashes or lesions.  Lymphatics: No cervical, supraclavicular, or inguinal adenopathy.  GU:  Normal external female genitalia. No lesions. No discharge or bleeding.             Bladder/urethra:  No lesions or masses, well supported bladder             Vagina: well rugated, no bleeding.  Minimal discharge noted.             Cervix: Somewhat flush with the vagina. Cervix normal in appearance with the exception of 1cm polypoid tissue replacing the external os. With the patient's verbal consent, the cervix was prepped with betadine x3. The polypoid lesion was grasped with ringed forceps and twisted until detached. This was placed in formalin. A single tooth tenaculum was placed on the posterior lip of the cervix and multiple attempts were made to cannulate the external os (still with some polypoid appearing tissue obscuring the os itself) with both an endometrial pipelle and an os finder. The procedure was aborted when the patient voiced having significant cramping.              Bimanual exam deferred to the OR.  LABORATORY AND RADIOLOGIC DATA:  Outside medical records were reviewed to synthesize the above history, along with the history and physical obtained during the  visit.   Lab Results  Component Value Date   WBC 7.1 08/20/2019   HGB 12.2 08/20/2019   HCT 36.7 08/20/2019   PLT 185 08/20/2019   GLUCOSE 100 (H) 08/20/2019   ALT 11 08/20/2019   AST 13 (L) 08/20/2019   NA 138 08/20/2019   K 4.0 08/20/2019   CL 105 08/20/2019   CREATININE 0.55 08/20/2019   BUN 9 08/20/2019   CO2 24 08/20/2019   Pelvic ultrasound 7/14: Uterus Measurements: 9.8 x 4.7 x 6.2 cm = volume: 148 mL. No fibroids or other mass visualized. Endometrium Thickness: 4.3 mm. The endometrial appears to be filled with heterogeneous hyperdense fluid extending into the lower uterine segment and cervix. Right ovary Measurements: 4.8 x 2.4 x 2.6 cm = volume: 16 mL. Normal appearance/no adnexal mass. Left ovary Measurements: 3.4 x 2.3 x 2.4 cm = volume: 10 mL. Normal appearance/no adnexal mass. Other findings No abnormal free fluid. IMPRESSION: Dilated endometrial canal extending into the lower uterine segment and cervix with heterogeneous hyperechoic debris/blood products. Normal appearing ovaries.  01/10/2019  1. 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  Margins: Superficial: Positive for HSIL (per report, exocervical, 12-3-6:00) Deep: Negative  2. Cervix, exocervix, superior, loop electrosurgical excision procedure (LEEP): Invasive squamous cell carcinoma in a background of high grade squamous intraepithelial lesion (HSIL)/cervical intraepithelial neoplasia-3 (CIN-3). Tumor size: 5 mm (largest focus on a single section, with invasive carcinoma present on two sections. Depth of invasion: 3.5 mm. Tumor grade: Moderately differentiated. Lymphovascular invasion: Not identified  Margins for invasive carcinoma: Superficial: Negative. Invasive carcinoma is present 0.1 mm from the superficial margin  (endocervical, per report). Deep: Negative. Invasive carcinoma is present 5 mm from the deep margin. Margins for HSIL: Superficial: Positive for HSIL (endocervical and exocervical, 9-12:00, per report)  Deep: Negative   Note: Based on these findings, the pathologic stage is pT1a2 by AJCC and FIGO criteria.  06/04/19 A. Cervix, cold knife cone excision: -Negative for residual carcinoma. -Benign endocervix and ectocervix with chronic inflammation.  B. Vagina nodule, excision: -Benign squamous mucosa and granulation tissue.

## 2019-08-21 NOTE — H&P (View-Only) (Signed)
GYNECOLOGIC ONCOLOGY NEW PATIENT CONSULTATION   Patient Name: Cassandra Watts  Patient Age: 29 y.o. Date of Service: 08/22/19 Referring Provider: Dr. 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  We reviewed findings of her ultrasound and discussed stenosis as cause of her amenorrhea as well as her thickened endometrial lining and pain.  I offered an attempt at cervical dilation here in the clinic versus in the operating room next week under light anesthesia.  The patient was amenable to an attempt here in clinic.  Unfortunately, I was not able to cannulate the cervix and ultimately the patient's desire was to postpone any further attempts until this could be done in the operating room.  We discussed doing the procedure next Wednesday. I will call the patient next week once I have the pathology results from the polypoid lesion removed.  A copy of this note was sent to the patient's referring provider.   50 minutes of total time was spent for this patient encounter, including preparation, face-to-face counseling with the patient and coordination of care, and documentation of the encounter.  Jeral Pinch, MD  Division of Gynecologic Oncology  Department of Obstetrics and Gynecology  [facility]  ___________________________________________  Chief Complaint: Chief Complaint  Patient presents with  . Hematometra    New Patient  . Malignant neoplasm of cervix, unspecified site [facility])    History of Present Illness:  Cassandra Watts is a 29 y.o. y.o. female who is seen in consultation at the request of Dr. Marlaine Hind for an evaluation of  hematometra in the setting of cervical conization earlier this year for diagnosis of IA2 squamous cell carcinoma of the cervix.  The patient presents today after being seen in the emergency department here on Wednesday secondary to abdominal and pelvic pain/cramping.  Pelvic ultrasound performed at that ED visit showed significantly dilated endometrial cavity with hematometra.  Her history is notable for early stage cervical cancer treated with a LEEP followed by a cold knife cone completed in late April of this year.  She was counseled on recommendation to proceed with pelvic lymph node assessment when her cold knife cone resulted showing no residual disease but opted for close surveillance without surgical lymph node assessment.  Her last menses was in March.  She denies any vaginal bleeding or menstrual bleeding since surgery.  After April, she had several episodes of light pelvic cramping as if she were going to get her menses, but she never did.  Wednesday afternoon of this week, she began having significant abdominal pain and cramping.  She also notes several weeks of intermittent nausea (seems related to the quantity of food that she tries to eat) as well as early satiety.  She endorses normal bowel and bladder function and denies any fevers or chills.  PAST MEDICAL HISTORY:  Past Medical History:  Diagnosis Date  . Cervical cancer ([place])      PAST SURGICAL HISTORY:  Past Surgical History:  Procedure Laterality Date  . BREAST BIOPSY     growth  . CERVICAL CONE BIOPSY  2021  . SKIN BIOPSY      OB/GYN HISTORY:  OB History  Gravida Para Term Preterm AB Living  2  2          SAB TAB Ectopic Multiple Live Births               # Outcome Date GA Lbr Len/2nd Weight Sex Delivery Anes PTL Lv  2 Para           1 Para             No LMP recorded.  Age at menarche: 28  Age at menopause: n/a Hx of HRT: n/a Last pap: 2021 History of abnormal pap smears: yes, see  HPI  MEDICATIONS: Outpatient Encounter Medications as of 08/22/2019  Medication Sig  . ferrous sulfate 325 (65 FE) MG tablet Take 325 mg by mouth daily with breakfast.   . [DISCONTINUED] HYDROcodone-acetaminophen (NORCO) 5-325 MG tablet Take 1-2 tablets by mouth every 6 (six) hours as needed. (Patient not taking: Reported on 08/22/2019)   No facility-administered encounter medications on file as of 08/22/2019.    ALLERGIES:  Allergies  Allergen Reactions  . Oxycodone Shortness Of Breath  . Penicillins Rash     FAMILY HISTORY:  Family History  Problem Relation Age of Onset  . Cancer Mother        possible uterine cancer (abnormal cells)  . Cancer Sister        abnormal cells  . Cancer Maternal Aunt        breast     SOCIAL HISTORY:    Social Connections:   . Frequency of Communication with Friends and Family:   . Frequency of Social Gatherings with Friends and Family:   . Attends Religious Services:   . Active Member of Clubs or Organizations:   . Attends Archivist Meetings:   Marland Kitchen Marital Status:     REVIEW OF SYSTEMS:  + Intermittent nausea, early satiety, abdominal/pelvic pain, headaches. Denies appetite changes, fevers, chills, fatigue, unexplained weight changes. Denies hearing loss, neck lumps or masses, mouth sores, ringing in ears or voice changes. Denies cough or wheezing.  Denies shortness of breath. Denies chest pain or palpitations. Denies leg swelling. Denies abdominal distention, blood in stools, constipation, diarrhea, vomiting. Denies pain with intercourse, dysuria, frequency, hematuria or incontinence. Denies hot flashes, vaginal bleeding or vaginal discharge.   Denies joint pain, back pain or muscle pain/cramps. Denies itching, rash, or wounds. Denies dizziness, numbness or seizures. Denies swollen lymph nodes or glands, denies easy bruising or bleeding. Denies anxiety, depression, confusion, or decreased concentration.  Physical Exam:   Vital Signs for this encounter:  Blood pressure 104/72, pulse 68, temperature 98.2 F (36.8 C), temperature source Oral, resp. rate 16, height 5\' 6"  (1.676 m), weight 142 lb (64.4 kg), SpO2 100 %. Body mass index is 22.92 kg/m. General: Alert, oriented, no acute distress.  HEENT: Normocephalic, atraumatic. Sclera anicteric.  Chest: Clear to auscultation bilaterally. No wheezes, rhonchi, or rales. Cardiovascular: Regular rate and rhythm, no murmurs, rubs, or gallops.  Abdomen: Normoactive bowel sounds. Soft, nondistended, mildly tender to palpation in lower mid-abdomen. No masses or hepatosplenomegaly appreciated. No palpable fluid wave.  Extremities: Grossly normal range of motion. Warm, well perfused. No edema bilaterally.  Skin: No rashes or lesions.  Lymphatics: No cervical, supraclavicular, or inguinal adenopathy.  GU:  Normal external female genitalia. No lesions. No discharge or bleeding.             Bladder/urethra:  No lesions or masses, well supported bladder             Vagina: well rugated, no bleeding.  Minimal discharge noted.             Cervix: Somewhat flush with the vagina. Cervix normal in appearance with the exception of 1cm polypoid tissue replacing the external os. With the patient's verbal consent, the cervix was prepped with betadine x3. The polypoid lesion was grasped with ringed forceps and twisted until detached. This was placed in formalin. A single tooth tenaculum was placed on the posterior lip of the cervix and multiple attempts were made to cannulate the external os (still with some polypoid appearing tissue obscuring the os itself) with both an endometrial pipelle and an os finder. The procedure was aborted when the patient voiced having significant cramping.              Bimanual exam deferred to the OR.  LABORATORY AND RADIOLOGIC DATA:  Outside medical records were reviewed to synthesize the above history, along with the history and physical obtained during the  visit.   Lab Results  Component Value Date   WBC 7.1 08/20/2019   HGB 12.2 08/20/2019   HCT 36.7 08/20/2019   PLT 185 08/20/2019   GLUCOSE 100 (H) 08/20/2019   ALT 11 08/20/2019   AST 13 (L) 08/20/2019   NA 138 08/20/2019   K 4.0 08/20/2019   CL 105 08/20/2019   CREATININE 0.55 08/20/2019   BUN 9 08/20/2019   CO2 24 08/20/2019   Pelvic ultrasound 7/14: Uterus Measurements: 9.8 x 4.7 x 6.2 cm = volume: 148 mL. No fibroids or other mass visualized. Endometrium Thickness: 4.3 mm. The endometrial appears to be filled with heterogeneous hyperdense fluid extending into the lower uterine segment and cervix. Right ovary Measurements: 4.8 x 2.4 x 2.6 cm = volume: 16 mL. Normal appearance/no adnexal mass. Left ovary Measurements: 3.4 x 2.3 x 2.4 cm = volume: 10 mL. Normal appearance/no adnexal mass. Other findings No abnormal free fluid. IMPRESSION: Dilated endometrial canal extending into the lower uterine segment and cervix with heterogeneous hyperechoic debris/blood products. Normal appearing ovaries.  01/10/2019  1. 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  Margins: Superficial: Positive for HSIL (per report, exocervical, 12-3-6:00) Deep: Negative  2. Cervix, exocervix, superior, loop electrosurgical excision procedure (LEEP): Invasive squamous cell carcinoma in a background of high grade squamous intraepithelial lesion (HSIL)/cervical intraepithelial neoplasia-3 (CIN-3). Tumor size: 5 mm (largest focus on a single section, with invasive carcinoma present on two sections. Depth of invasion: 3.5 mm. Tumor grade: Moderately differentiated. Lymphovascular invasion: Not identified  Margins for invasive carcinoma: Superficial: Negative. Invasive carcinoma is present 0.1 mm from the superficial margin  (endocervical, per report). Deep: Negative. Invasive carcinoma is present 5 mm from the deep margin. Margins for HSIL: Superficial: Positive for HSIL (endocervical and exocervical, 9-12:00, per report)  Deep: Negative   Note: Based on these findings, the pathologic stage is pT1a2 by AJCC and FIGO criteria.  06/04/19 A. Cervix, cold knife cone excision: -Negative for residual carcinoma. -Benign endocervix and ectocervix with chronic inflammation.  B. Vagina nodule, excision: -Benign squamous mucosa and granulation tissue.

## 2019-08-22 ENCOUNTER — Encounter (HOSPITAL_BASED_OUTPATIENT_CLINIC_OR_DEPARTMENT_OTHER): Payer: Self-pay | Admitting: Gynecologic Oncology

## 2019-08-22 ENCOUNTER — Other Ambulatory Visit (HOSPITAL_COMMUNITY): Payer: Self-pay | Admitting: Gynecologic Oncology

## 2019-08-22 ENCOUNTER — Other Ambulatory Visit: Payer: Self-pay | Admitting: Gynecologic Oncology

## 2019-08-22 ENCOUNTER — Encounter: Payer: Self-pay | Admitting: Gynecologic Oncology

## 2019-08-22 ENCOUNTER — Other Ambulatory Visit: Payer: Self-pay

## 2019-08-22 ENCOUNTER — Inpatient Hospital Stay: Payer: No Typology Code available for payment source | Attending: Gynecologic Oncology | Admitting: Gynecologic Oncology

## 2019-08-22 VITALS — BP 104/72 | HR 68 | Temp 98.2°F | Resp 16 | Ht 66.0 in | Wt 142.0 lb

## 2019-08-22 DIAGNOSIS — N857 Hematometra: Secondary | ICD-10-CM

## 2019-08-22 DIAGNOSIS — Z8541 Personal history of malignant neoplasm of cervix uteri: Secondary | ICD-10-CM

## 2019-08-22 DIAGNOSIS — C539 Malignant neoplasm of cervix uteri, unspecified: Secondary | ICD-10-CM | POA: Insufficient documentation

## 2019-08-22 NOTE — Addendum Note (Signed)
Addended by: Joylene John D on: 08/22/2019 03:12 PM   Modules accepted: Orders

## 2019-08-22 NOTE — Patient Instructions (Signed)
Preparing for your Surgery  Plan for surgery on August 27, 2019 with Dr. Jeral Pinch at [facility]. You will be scheduled for a dilation and curettage of the uterus.   Pre-operative Testing -You will receive a phone call from presurgical testing at [facility] to arrange for a pre-operative appointment over the phone, lab appointment, and COVID test. The COVID test normally happens 3 days prior to the surgery and they ask that you self quarantine after the test up until surgery to decrease chance of exposure.  -Bring your insurance card, copy of an advanced directive if applicable, medication list     -You should not be taking blood thinners or aspirin at least ten days prior to surgery unless instructed by your surgeon.  -Do not take supplements such as fish oil (omega 3), red yeast rice, turmeric before your surgery.   Day Before Surgery at [place] will be advised you can have clear liquids after midnight and up until 3 hours before your surgery.    Your role in recovery Your role is to become active as soon as directed by your doctor, while still giving yourself time to heal.  Rest when you feel tired. You will be asked to do the following in order to speed your recovery:  - Cough and breathe deeply. This helps to clear and expand your lungs and can prevent pneumonia after surgery.  - [place]. Do mild physical activity. Walking or moving your legs help your circulation and body functions return to normal. Do not try to get up or walk alone the first time after surgery.   -If you develop swelling on one leg or the other, pain in the back of your leg, redness/warmth in one of your legs, please call the office or go to the Emergency Room to have a doppler to rule out a blood clot. For shortness of breath, chest pain-seek care in the Emergency Room as soon as possible. - Actively manage your pain. Managing your pain lets you move in  comfort. We will ask you to rate your pain on a scale of zero to 10. It is your responsibility to tell your doctor or nurse where and how much you hurt so your pain can be treated.  Pain Management After Surgery -Make sure that you have Tylenol and Ibuprofen at home to use on a regular basis after surgery for pain control. We recommend alternating the medications every hour to six hours since they work differently and are processed in the body differently for pain relief.  Risks of Surgery Risks of surgery are low but include bleeding, infection, damage to surrounding structures, re-operation, blood clots, and very rarely death.   AFTER SURGERY INSTRUCTIONS  Return to work: 1-2 weeks if applicable  Activity: 1. Be up and out of the bed during the day.  Take a nap if needed.  You may walk up steps but be careful and use the hand rail.  Stair climbing will tire you more than you think, you may need to stop part way and rest.   2. No lifting or straining for 2 weeks over 10 pounds. No pushing, pulling, straining for 2 weeks.  3. No driving for 48 hours after the procedure.    4. You can shower as soon as the next day after surgery. Shower daily.  Use soap and water on your incision and pat dry; don't rub.  No tub baths or  submerging your body in water until cleared by your surgeon. If you have the soap that was given to you by pre-surgical testing that was used before surgery, you do not need to use it afterwards because this can irritate your incisions.   5. No sexual activity and nothing in the vagina for 4 weeks.  8. You may experience vaginal spotting after surgery.  The spotting is normal but if you experience heavy bleeding, call our office.  9. Take Tylenol or ibuprofen for pain.  Monitor your Tylenol intake to a max of 4,000 mg in a 24 hour period. You can alternate these medications after surgery.  Diet: 1. Low sodium Heart Healthy Diet is recommended.  2. It is safe to use a  laxative, such as Miralax or Colace, if you have difficulty moving your bowels.  Reasons to call the Doctor:  Fever - Oral temperature greater than 100.4 degrees Fahrenheit  Foul-smelling vaginal discharge  Difficulty urinating  Nausea and vomiting  Difficulty breathing with or without chest pain  New calf pain especially if only on one side  Sudden, continuing increased vaginal bleeding with or without clots.   Contacts: For questions or concerns you should contact:  Dr. Jeral Pinch at [phone]  Joylene John, NP at [phone]  After Hours: call [phone] and have the GYN Oncologist paged/contacted

## 2019-08-22 NOTE — Progress Notes (Addendum)
ADDENDUM:  Chart reviewed by anesthesia, Konrad Felix PA,  Whom spoke w/ cardiology, Kerin Ransom PA (refer to his progress note 08-26-2019 in epic), ok to proceed.  Spoke w/ via phone for pre-op interview--- PT Lab needs dos----  Urine preg             Lab results------ current CBC/ CMP done 08-20-2019, results in epic/ chart;  Current ekg, dated 07-17-2019 in epic/ chart COVID test ------ 08-23-2019 @ 1020 Arrive at ------- 1015 NPO after MN NO Solid Food.  Clear liquids from MN until--- 0915 then nothing by mouth Medications to take morning of surgery ----- NONE Diabetic medication -----  n/a Patient Special Instructions ----- n/a Pre-Op special Istructions ----- n/a Patient verbalized understanding of instructions that were given at this phone interview. Patient denies shortness of breath, chest pain, fever, cough a this phone interview.

## 2019-08-23 ENCOUNTER — Other Ambulatory Visit (HOSPITAL_COMMUNITY)
Admission: RE | Admit: 2019-08-23 | Discharge: 2019-08-23 | Disposition: A | Discharge status: Home / Self Care | Payer: No Typology Code available for payment source | Source: Ambulatory Visit | Attending: Gynecologic Oncology | Admitting: Gynecologic Oncology

## 2019-08-23 DIAGNOSIS — Z01812 Encounter for preprocedural laboratory examination: Secondary | ICD-10-CM | POA: Insufficient documentation

## 2019-08-23 DIAGNOSIS — Z20822 Contact with and (suspected) exposure to covid-19: Secondary | ICD-10-CM | POA: Diagnosis not present

## 2019-08-23 LAB — SARS CORONAVIRUS 2 (TAT 6-24 HRS): SARS Coronavirus 2: NEGATIVE

## 2019-08-25 ENCOUNTER — Telehealth: Payer: Self-pay

## 2019-08-25 LAB — SURGICAL PATHOLOGY

## 2019-08-25 NOTE — Telephone Encounter (Signed)
Told Cassandra Watts that the pathology from the biopsy showed healing tissue. No cancer or pre-cancer seen.  Reviewed written pre-op instructions with Cassandra Watts.  Pt verbalized understanding and had no questions or concerns at this time.

## 2019-08-26 ENCOUNTER — Encounter: Payer: Self-pay | Admitting: Cardiology

## 2019-08-26 NOTE — Progress Notes (Signed)
Asked to clear this patient for D+C tomorrow. She saw Dr Gardiner Rhyme 07/17/2019.  CXR, echo, and sed rate all normal.  I did not think her D+C needed to be held up for ZIO monitor results and I cleared her for D+C.Marland Kitchen  Kerin Ransom PA-C 08/26/2019 2:03 PM

## 2019-08-27 ENCOUNTER — Ambulatory Visit (HOSPITAL_BASED_OUTPATIENT_CLINIC_OR_DEPARTMENT_OTHER): Payer: No Typology Code available for payment source | Admitting: Anesthesiology

## 2019-08-27 ENCOUNTER — Encounter (HOSPITAL_BASED_OUTPATIENT_CLINIC_OR_DEPARTMENT_OTHER): Payer: Self-pay | Admitting: Gynecologic Oncology

## 2019-08-27 ENCOUNTER — Other Ambulatory Visit: Payer: Self-pay

## 2019-08-27 ENCOUNTER — Encounter (HOSPITAL_BASED_OUTPATIENT_CLINIC_OR_DEPARTMENT_OTHER)
Admission: RE | Disposition: A | Discharge status: Home / Self Care | Payer: Self-pay | Source: Home / Self Care | Attending: Gynecologic Oncology

## 2019-08-27 ENCOUNTER — Ambulatory Visit (HOSPITAL_COMMUNITY): Payer: No Typology Code available for payment source

## 2019-08-27 ENCOUNTER — Ambulatory Visit (HOSPITAL_BASED_OUTPATIENT_CLINIC_OR_DEPARTMENT_OTHER)
Admission: RE | Admit: 2019-08-27 | Discharge: 2019-08-27 | Disposition: A | Discharge status: Home / Self Care | Payer: No Typology Code available for payment source | Attending: Gynecologic Oncology | Admitting: Gynecologic Oncology

## 2019-08-27 DIAGNOSIS — C539 Malignant neoplasm of cervix uteri, unspecified: Secondary | ICD-10-CM | POA: Diagnosis not present

## 2019-08-27 DIAGNOSIS — N857 Hematometra: Secondary | ICD-10-CM | POA: Diagnosis present

## 2019-08-27 DIAGNOSIS — Z9889 Other specified postprocedural states: Secondary | ICD-10-CM | POA: Diagnosis not present

## 2019-08-27 DIAGNOSIS — Z885 Allergy status to narcotic agent status: Secondary | ICD-10-CM | POA: Diagnosis not present

## 2019-08-27 DIAGNOSIS — N912 Amenorrhea, unspecified: Secondary | ICD-10-CM | POA: Diagnosis not present

## 2019-08-27 DIAGNOSIS — Z880 Allergy status to penicillin: Secondary | ICD-10-CM | POA: Insufficient documentation

## 2019-08-27 HISTORY — DX: Palpitations: R00.2

## 2019-08-27 HISTORY — PX: DILATION AND CURETTAGE OF UTERUS: SHX78

## 2019-08-27 HISTORY — DX: Personal history of malignant neoplasm of cervix uteri: Z85.41

## 2019-08-27 HISTORY — DX: Hematometra: N85.7

## 2019-08-27 HISTORY — DX: Dermatitis, unspecified: L30.9

## 2019-08-27 HISTORY — DX: Iron deficiency anemia, unspecified: D50.9

## 2019-08-27 LAB — POCT PREGNANCY, URINE: Preg Test, Ur: NEGATIVE

## 2019-08-27 SURGERY — DILATION AND CURETTAGE
Anesthesia: General | Site: Uterus

## 2019-08-27 MED ORDER — LACTATED RINGERS IV SOLN: INTRAVENOUS | Status: DC

## 2019-08-27 MED ORDER — CELECOXIB 200 MG PO CAPS
ORAL_CAPSULE | ORAL | Status: AC
  Filled 2019-08-27: qty 2

## 2019-08-27 MED ORDER — HYDROCODONE-ACETAMINOPHEN 7.5-325 MG PO TABS: 1.0000 | ORAL_TABLET | Freq: Once | ORAL | Status: DC | PRN

## 2019-08-27 MED ORDER — PROPOFOL 10 MG/ML IV BOLUS
INTRAVENOUS | Status: AC
  Filled 2019-08-27: qty 20

## 2019-08-27 MED ORDER — LIDOCAINE HCL 1 % IJ SOLN
INTRAMUSCULAR | Status: DC | PRN
  Administered 2019-08-27: 10 mL

## 2019-08-27 MED ORDER — LIDOCAINE HCL (CARDIAC) PF 100 MG/5ML IV SOSY
PREFILLED_SYRINGE | INTRAVENOUS | Status: DC | PRN
  Administered 2019-08-27: 60 mg via INTRAVENOUS

## 2019-08-27 MED ORDER — SCOPOLAMINE 1 MG/3DAYS TD PT72
1.0000 | MEDICATED_PATCH | TRANSDERMAL | Status: DC
  Administered 2019-08-27: 1.5 mg via TRANSDERMAL

## 2019-08-27 MED ORDER — FENTANYL CITRATE (PF) 100 MCG/2ML IJ SOLN: 25.0000 ug | INTRAMUSCULAR | Status: DC | PRN

## 2019-08-27 MED ORDER — LIDOCAINE HCL 1 % IJ SOLN
INTRAMUSCULAR | Status: AC
  Filled 2019-08-27: qty 20

## 2019-08-27 MED ORDER — ACETAMINOPHEN 500 MG PO TABS
ORAL_TABLET | ORAL | Status: AC
  Filled 2019-08-27: qty 2

## 2019-08-27 MED ORDER — ONDANSETRON HCL 4 MG/2ML IJ SOLN
INTRAMUSCULAR | Status: AC
  Filled 2019-08-27: qty 2

## 2019-08-27 MED ORDER — MIDAZOLAM HCL 5 MG/5ML IJ SOLN
INTRAMUSCULAR | Status: DC | PRN
  Administered 2019-08-27: 2 mg via INTRAVENOUS

## 2019-08-27 MED ORDER — ONDANSETRON HCL 4 MG/2ML IJ SOLN
INTRAMUSCULAR | Status: DC | PRN
  Administered 2019-08-27: 4 mg via INTRAVENOUS

## 2019-08-27 MED ORDER — DEXAMETHASONE SODIUM PHOSPHATE 10 MG/ML IJ SOLN
INTRAMUSCULAR | Status: AC
  Filled 2019-08-27: qty 1

## 2019-08-27 MED ORDER — CELECOXIB 200 MG PO CAPS
400.0000 mg | ORAL_CAPSULE | ORAL | Status: AC
  Administered 2019-08-27: 400 mg via ORAL

## 2019-08-27 MED ORDER — ACETAMINOPHEN 500 MG PO TABS
1000.0000 mg | ORAL_TABLET | ORAL | Status: AC
  Administered 2019-08-27: 1000 mg via ORAL

## 2019-08-27 MED ORDER — SCOPOLAMINE 1 MG/3DAYS TD PT72
MEDICATED_PATCH | TRANSDERMAL | Status: AC
  Filled 2019-08-27: qty 1

## 2019-08-27 MED ORDER — FENTANYL CITRATE (PF) 100 MCG/2ML IJ SOLN
INTRAMUSCULAR | Status: AC
  Filled 2019-08-27: qty 2

## 2019-08-27 MED ORDER — ONDANSETRON HCL 4 MG/2ML IJ SOLN: 4.0000 mg | Freq: Once | INTRAMUSCULAR | Status: DC | PRN

## 2019-08-27 MED ORDER — KETOROLAC TROMETHAMINE 30 MG/ML IJ SOLN
INTRAMUSCULAR | Status: DC | PRN
  Administered 2019-08-27: 30 mg via INTRAVENOUS

## 2019-08-27 MED ORDER — PROPOFOL 10 MG/ML IV BOLUS
INTRAVENOUS | Status: DC | PRN
  Administered 2019-08-27: 200 mg via INTRAVENOUS

## 2019-08-27 MED ORDER — FENTANYL CITRATE (PF) 100 MCG/2ML IJ SOLN
INTRAMUSCULAR | Status: DC | PRN
  Administered 2019-08-27: 100 ug via INTRAVENOUS

## 2019-08-27 MED ORDER — SILVER NITRATE-POT NITRATE 75-25 % EX MISC
CUTANEOUS | Status: AC
  Filled 2019-08-27: qty 10

## 2019-08-27 MED ORDER — DEXAMETHASONE SODIUM PHOSPHATE 10 MG/ML IJ SOLN
INTRAMUSCULAR | Status: DC | PRN
  Administered 2019-08-27: 4 mg via INTRAVENOUS

## 2019-08-27 MED ORDER — MIDAZOLAM HCL 2 MG/2ML IJ SOLN
INTRAMUSCULAR | Status: AC
Start: 2019-08-27 — End: ?
  Filled 2019-08-27: qty 2

## 2019-08-27 MED ORDER — KETOROLAC TROMETHAMINE 30 MG/ML IJ SOLN
INTRAMUSCULAR | Status: AC
  Filled 2019-08-27: qty 1

## 2019-08-27 MED ORDER — ONDANSETRON HCL 4 MG/2ML IJ SOLN: 4.0000 mg | Freq: Four times a day (QID) | INTRAMUSCULAR | Status: DC | PRN

## 2019-08-27 MED ORDER — ONDANSETRON HCL 4 MG PO TABS: 4.0000 mg | ORAL_TABLET | Freq: Four times a day (QID) | ORAL | Status: DC | PRN

## 2019-08-27 MED ORDER — DEXAMETHASONE SODIUM PHOSPHATE 10 MG/ML IJ SOLN: 4.0000 mg | INTRAMUSCULAR | Status: DC

## 2019-08-27 MED ORDER — LIDOCAINE 2% (20 MG/ML) 5 ML SYRINGE
INTRAMUSCULAR | Status: AC
  Filled 2019-08-27: qty 5

## 2019-08-27 SURGICAL SUPPLY — 19 items
CATH ROBINSON RED A/P 16FR (CATHETERS) ×4 IMPLANT
CNTNR URN SCR LID CUP LEK RST (MISCELLANEOUS) ×2 IMPLANT
CONT SPEC 4OZ STRL OR WHT (MISCELLANEOUS) ×4
COVER WAND RF STERILE (DRAPES) ×4 IMPLANT
GLOVE BIO SURGEON STRL SZ 6 (GLOVE) ×8 IMPLANT
GLOVE BIO SURGEON STRL SZ 6.5 (GLOVE) ×3 IMPLANT
GLOVE BIO SURGEONS STRL SZ 6.5 (GLOVE) ×1
GLOVE BIOGEL PI IND STRL 7.0 (GLOVE) ×4 IMPLANT
GLOVE BIOGEL PI INDICATOR 7.0 (GLOVE) ×4
GOWN STRL REUS W/ TWL LRG LVL3 (GOWN DISPOSABLE) ×4 IMPLANT
GOWN STRL REUS W/TWL LRG LVL3 (GOWN DISPOSABLE) ×8
KIT TURNOVER CYSTO (KITS) IMPLANT
NEEDLE SPNL 22GX3.5 QUINCKE BK (NEEDLE) IMPLANT
NS IRRIG 1000ML POUR BTL (IV SOLUTION) ×4 IMPLANT
PACK VAGINAL MINOR WOMEN LF (CUSTOM PROCEDURE TRAY) ×4 IMPLANT
PAD OB MATERNITY 4.3X12.25 (PERSONAL CARE ITEMS) ×4 IMPLANT
PENCIL SMOKE EVACUATOR (MISCELLANEOUS) IMPLANT
TOWEL OR 17X26 10 PK STRL BLUE (TOWEL DISPOSABLE) ×4 IMPLANT
UNDERPAD 30X36 HEAVY ABSORB (UNDERPADS AND DIAPERS) ×4 IMPLANT

## 2019-08-27 NOTE — Anesthesia Postprocedure Evaluation (Signed)
Anesthesia Post Note  Patient: Cassandra Watts  Procedure(s) Performed: DILATATION AND CURETTAGE OF UTERUS (N/A Uterus)     Patient location during evaluation: PACU Anesthesia Type: General Level of consciousness: awake and alert Pain management: pain level controlled Vital Signs Assessment: post-procedure vital signs reviewed and stable Respiratory status: spontaneous breathing, nonlabored ventilation and respiratory function stable Cardiovascular status: blood pressure returned to baseline and stable Postop Assessment: no apparent nausea or vomiting Anesthetic complications: no   No complications documented.  Last Vitals:  Vitals:   08/27/19 1300 08/27/19 1315  BP: 102/66 102/73  Pulse: (!) 59 66  Resp: 18 19  Temp:    SpO2: 100% 100%    Last Pain:  Vitals:   08/27/19 1315  TempSrc:   PainSc: 0-No pain                 Lidia Collum

## 2019-08-27 NOTE — Anesthesia Preprocedure Evaluation (Signed)
Anesthesia Evaluation  Patient identified by MRN, date of birth, ID band Patient awake    Reviewed: Allergy & Precautions, NPO status , Patient's Chart, lab work & pertinent test results  History of Anesthesia Complications Negative for: history of anesthetic complications  Airway Mallampati: III  TM Distance: >3 FB Neck ROM: Full    Dental  (+) Teeth Intact   Pulmonary neg pulmonary ROS, former smoker,    Pulmonary exam normal        Cardiovascular negative cardio ROS Normal cardiovascular exam     Neuro/Psych negative neurological ROS  negative psych ROS   GI/Hepatic negative GI ROS, Neg liver ROS,   Endo/Other  negative endocrine ROS  Renal/GU negative Renal ROS  negative genitourinary   Musculoskeletal negative musculoskeletal ROS (+)   Abdominal   Peds  Hematology negative hematology ROS (+)   Anesthesia Other Findings   Reproductive/Obstetrics H/o cervical cancer  hematometra                             Anesthesia Physical Anesthesia Plan  ASA: II  Anesthesia Plan: General   Post-op Pain Management:    Induction: Intravenous  PONV Risk Score and Plan: 3 and Ondansetron, Dexamethasone, Midazolam and Treatment may vary due to age or medical condition  Airway Management Planned: LMA  Additional Equipment: None  Intra-op Plan:   Post-operative Plan: Extubation in OR  Informed Consent: I have reviewed the patients History and Physical, chart, labs and discussed the procedure including the risks, benefits and alternatives for the proposed anesthesia with the patient or authorized representative who has indicated his/her understanding and acceptance.     Dental advisory given  Plan Discussed with:   Anesthesia Plan Comments:         Anesthesia Quick Evaluation

## 2019-08-27 NOTE — Op Note (Signed)
PATIENT: Cassandra Watts DATE: 08/27/19  Preop Diagnosis: Hematometra, history of cervix cancer  Postoperative Diagnosis: same as above  Surgery: D&C (dilation and curettage)  Surgeons:  Jeral Pinch, MD Assistant: none  Anesthesia: General   Estimated blood loss: 5 ml  IVF:  See I&O flowsheet   Urine output: 10 ml   Complications: None   Pathology: none  Operative findings: On EUA, 8-10cm bulbous and mobile uterus. No firmness or nodularity. On speculum exam, stenotic os. Drainage of gelatinous and old appearing blood, no tissue, upon dilation of the cervix and curettage.  Procedure: The patient was identified in the preoperative holding area. Informed consent was signed on the chart. Patient was seen history was reviewed and exam was performed.   The patient was then taken to the operating room and placed in the supine position with SCD hose on. General anesthesia was then induced without difficulty. She was then placed in the dorsolithotomy position. The perineum was prepped with Betadine. The vagina was prepped with Betadine. The patient was then draped after the prep was dried. A red rubber to empty the bladder was performed under aseptic conditions.  Timeout was performed the patient, procedure, antibiotic, allergy, and length of procedure.   The speculum was placed in the vagina. The single tooth tenaculum was placed on the anterior lip of the cervix. The 78F dilator was used to gently cannulate the cervix. The uterine sound was placed in the cervix and advanced to the fundus, with the cavity measuring 8cm. The cervix was successively dilated using pratts dilators to 178F.  A gentle curette was advanced to the fundus and a comprehensive curette of the endometrial cavity took place. No specimen present.   The tenaculum was removed and hemostasis was observed. The vagina was irrigated.  All instrument, suture, laparotomy, Ray-Tec, and needle counts were correct x2.   The patient tolerated the procedure well and was taken recovery room in stable condition.   Jeral Pinch MD Gynecologic Oncology

## 2019-08-27 NOTE — Anesthesia Procedure Notes (Signed)
Procedure Name: LMA Insertion Date/Time: 08/27/2019 12:22 PM Performed by: Jonna Munro, CRNA Pre-anesthesia Checklist: Patient identified, Emergency Drugs available, Suction available, Patient being monitored and Timeout performed Patient Re-evaluated:Patient Re-evaluated prior to induction Oxygen Delivery Method: Circle system utilized Preoxygenation: Pre-oxygenation with 100% oxygen Induction Type: IV induction LMA: LMA inserted LMA Size: 4.0 Number of attempts: 1 Placement Confirmation: positive ETCO2 and breath sounds checked- equal and bilateral Dental Injury: Teeth and Oropharynx as per pre-operative assessment

## 2019-08-27 NOTE — Interval H&P Note (Signed)
History and Physical Interval Note:  08/27/2019 11:11 AM  Cassandra Watts  has presented today for surgery, with the diagnosis of HEMATOMETRA, HISTORY OF CERVICAL CANCER.  The various methods of treatment have been discussed with the patient and family. After consideration of risks, benefits and other options for treatment, the patient has consented to  Procedure(s): DILATATION AND CURETTAGE OF UTERUS (N/A) POSSIBLE ULTRASOUND GUIDANCE (N/A) as a surgical intervention.  The patient's history has been reviewed, patient examined, no change in status, stable for surgery.  I have reviewed the patient's chart and labs.  Questions were answered to the patient's satisfaction.     Lafonda Mosses

## 2019-08-27 NOTE — Transfer of Care (Signed)
Immediate Anesthesia Transfer of Care Note  Patient: Cassandra Watts  Procedure(s) Performed: DILATATION AND CURETTAGE OF UTERUS (N/A Uterus)  Patient Location: PACU  Anesthesia Type:General  Level of Consciousness: awake, alert , oriented and patient cooperative  Airway & Oxygen Therapy: Patient Spontanous Breathing and Patient connected to nasal cannula oxygen  Post-op Assessment: Report given to RN and Post -op Vital signs reviewed and stable  Post vital signs: Reviewed and stable  Last Vitals:  Vitals Value Taken Time  BP    Temp    Pulse    Resp    SpO2      Last Pain:  Vitals:   08/27/19 1036  TempSrc: Oral         Complications: No complications documented.

## 2019-08-27 NOTE — Discharge Instructions (Addendum)
08/27/2019  Return to work: 1 day if applicable  Activity: 1. Be up and out of the bed during the day.  Take a nap if needed.    2. No driving for a minimum of 24 hours after the procedure.  Make sure your reaction time has returned.  4. Shower daily.  Use soap and water on your incision and pat dry; don't rub.  No tub baths or submerging your body in water for 2 weeks until cleared by your surgeon.   5. No sexual activity and nothing in the vagina for 2 weeks.  6. You may experience heavier than a period bleeding after the procedure as the blood that was trapped in the uterus comes out.  This should improve after several days. Call our office for any questions or concerns.  7. Take Tylenol or ibuprofen for pain. Monitor your Tylenol intake to a max of 4,000 mg.  Diet: 1. Low sodium Heart Healthy Diet is recommended.  2. It is safe to use a laxative, such as Miralax or Colace, if you have difficulty moving your bowels. You can take Sennakot at bedtime every evening to keep bowel movements regular and to prevent constipation.    Wound Care: 1. Keep clean and dry.  Shower daily.  Reasons to call the Doctor:  Fever - Oral temperature greater than 100.4 degrees Fahrenheit  Foul-smelling vaginal discharge  Difficulty urinating  Nausea and vomiting  Difficulty breathing with or without chest pain  New calf pain especially if only on one side  Sudden, continuing increased vaginal bleeding with or without clots.   Contacts: For questions or concerns you should contact:  Dr. Jeral Pinch at [phone]  Joylene John, NP at [phone]  After Hours: call [phone] and have the GYN Oncologist paged/contacted  May take Tylenol after 4:30 PM for pain if needed. May take Ibuprofen at 6 PM if needed for pain.   Post Anesthesia Home Care Instructions  Activity: Get plenty of rest for the remainder of the day. A responsible individual must stay with you for 24 hours  following the procedure.  For the next 24 hours, DO NOT: -Drive a car -Paediatric nurse -Drink alcoholic beverages -Take any medication unless instructed by your physician -Make any legal decisions or sign important papers.  Meals: Start with liquid foods such as gelatin or soup. Progress to regular foods as tolerated. Avoid greasy, spicy, heavy foods. If nausea and/or vomiting occur, drink only clear liquids until the nausea and/or vomiting subsides. Call your physician if vomiting continues.  Special Instructions/Symptoms: Your throat may feel dry or sore from the anesthesia or the breathing tube placed in your throat during surgery. If this causes discomfort, gargle with warm salt water. The discomfort should disappear within 24 hours.  If you had a scopolamine patch placed behind your ear for the management of post- operative nausea and/or vomiting:  1. The medication in the patch is effective for 72 hours, after which it should be removed.  Wrap patch in a tissue and discard in the trash. Wash hands thoroughly with soap and water. 2. You may remove the patch earlier than 72 hours if you experience unpleasant side effects which may include dry mouth, dizziness or visual disturbances. 3. Avoid touching the patch. Wash your hands with soap and water after contact with the patch.    Remove patch behind left ear by Saturday, July 24,2021.

## 2019-08-28 ENCOUNTER — Encounter (HOSPITAL_BASED_OUTPATIENT_CLINIC_OR_DEPARTMENT_OTHER): Payer: Self-pay | Admitting: Gynecologic Oncology

## 2019-09-04 ENCOUNTER — Telehealth: Payer: Self-pay

## 2019-09-04 ENCOUNTER — Telehealth (INDEPENDENT_AMBULATORY_CARE_PROVIDER_SITE_OTHER): Payer: No Typology Code available for payment source | Admitting: Cardiology

## 2019-09-04 ENCOUNTER — Encounter: Payer: Self-pay | Admitting: Cardiology

## 2019-09-04 VITALS — BP 110/73 | HR 75 | Ht 66.0 in | Wt 140.0 lb

## 2019-09-04 DIAGNOSIS — R001 Bradycardia, unspecified: Secondary | ICD-10-CM | POA: Diagnosis not present

## 2019-09-04 NOTE — Progress Notes (Signed)
Virtual Visit via Video Note   This visit type was conducted due to national recommendations for restrictions regarding the COVID-19 Pandemic (e.g. social distancing) in an effort to limit this patient's exposure and mitigate transmission in our community.  Due to her co-morbid illnesses, this patient is at least at moderate risk for complications without adequate follow up.  This format is felt to be most appropriate for this patient at this time.  All issues noted in this document were discussed and addressed.  A limited physical exam was performed with this format.  Please refer to the patient's chart for her consent to telehealth for [facility].      Date:  09/07/2019   ID:  Cassandra Watts, DOB Oct 14, 1990, MRN [id number]  Patient Location: Home Provider Location: Office/Clinic  PCP:  Caren Macadam, MD  Cardiologist:  No primary care provider on file.  Electrophysiologist:  None   Evaluation Performed:  Follow-Up Visit  Chief Complaint: Palpitations  History of Present Illness:    Cassandra Watts is a 29 y.o. female with with a hx of cervical cancer, former tobacco use who presents for follow-up.  She was referred by Dr. Mannie Stabile for evaluation of palpitations, initially seen on 07/17/2019.  She reports that in April she woke up in the middle of the night with palpitations.  Described as feeling that heart rate was going slow than fast.  Felt lightheaded.  Episode lasted about 30 minutes.  She went to sleep but felt very fatigued the following day.  She then had a subsequent episode the next day where he woke her up from sleep.  Since April has had 2 episodes of this.  Walks every day for 30 minutes.  Reports some lightheadedness with walking.  States that she has had chest pain since last week.  Reports left shoulder and left chest pain.  Pain has been constant.  Described as dull aching pain that is worse with deep breathing.  Denies any cough or fever.  Has not noted worsening with  exertion.  She smoked 1 pack/day x 1 year, quit in 2012.  Family history includes mother had arrhythmia (unclear diagnosis).  Labs on 5/24 showed normal thyroid, creatinine, electrolytes. Hgb 11.3.   Echocardiogram on 08/14/2019 showed normal biventricular function, no significant valvular disease Zio patch x14 days showed 5 episodes of 2:1 AV block lasting total of 16 seconds, occurring during the night.  Since last clinic visit, she reports she continues to have palpitations.  She reports that she is continuing to have episodes where she wakes up during the night and feels palpitations and lightheadedness.  Also reports issues with headaches throughout the day.    Past Medical History:  Diagnosis Date  . Dermatitis   . Hematometra    08-22-2019 per pt no menses since cervial conization surgery 06-04-2019, stated told her cervix is closed due to stenosis, she having abd. bloating/ pain/ cramping  . History of cervical cancer    SCC , Stage IA2----s/p  LEEP 12/ 2020 with positive margin's;  06-04-2019 s/p cold knife cervical conziation negative margin's  . IDA (iron deficiency anemia)   . Palpitations    08-22-2019 evaluation by cardiology, dr Oswaldo Milian, note in epic 07-17-2019;  echo and Zio 14 day monitor ordered;  echo done 08-14-2019 normal with ef 60-65% and pt stated completed ZIO monitor and mailed back approx. 08-14-2019     Past Surgical History:  Procedure Laterality Date  . BREAST BIOPSY Right 2013   benign  growth  . CERVICAL BIOPSY  W/ LOOP ELECTRODE EXCISION  01-10-2019   sedation in  GYN office  . CERVICAL CONIZATION W/BX  06-04-2019  [facility]  in [place]  . DILATION AND CURETTAGE OF UTERUS N/A 08/27/2019   Procedure: DILATATION AND CURETTAGE OF UTERUS;  Surgeon: Lafonda Mosses, MD;  Location: [facility];  Service: Gynecology;  Laterality: N/A;     Current Meds  Medication Sig  . ferrous sulfate 325 (65 FE) MG tablet Take 325 mg by  mouth daily with breakfast.   . ibuprofen (ADVIL) 200 MG tablet Take 200 mg by mouth every 6 (six) hours as needed.     Allergies:   Oxycodone and Penicillins   Social History   Tobacco Use  . Smoking status: Former Smoker    Packs/day: 1.00    Years: 1.00    Pack years: 1.00    Types: Cigarettes    Quit date: 08/22/2010    Years since quitting: 9.0  . Smokeless tobacco: Never Used  Vaping Use  . Vaping Use: Former  . Quit date: 08/22/2011  Substance Use Topics  . Alcohol use: Not Currently  . Drug use: Never     Family Hx: The patient's family history includes Cancer in her maternal aunt, mother, and sister.  ROS:   Please see the history of present illness.     All other systems reviewed and are negative.   Prior CV studies:   The following studies were reviewed today:    Labs/Other Tests and Data Reviewed:    EKG:  No ECG reviewed.  Recent Labs: 08/20/2019: ALT 11; BUN 9; Creatinine, Ser 0.55; Hemoglobin 12.2; Platelets 185; Potassium 4.0; Sodium 138   Recent Lipid Panel No results found for: CHOL, TRIG, HDL, CHOLHDL, LDLCALC, LDLDIRECT  Wt Readings from Last 3 Encounters:  09/04/19 140 lb (63.5 kg)  08/27/19 140 lb 14.4 oz (63.9 kg)  08/22/19 142 lb (64.4 kg)     Objective:    Vital Signs:  BP 110/73   Pulse 75   Ht 5\' 6"  (1.676 m)   Wt 140 lb (63.5 kg)   BMI 22.60 kg/m    VITAL SIGNS:  reviewed  Gen: Appears comfortable  ASSESSMENT & PLAN:    Bradycardia: Zio patch x14 days showed intermittent episodes of 2:1 AV block, occurring during the night.  Echocardiogram shows no evidence of structural heart disease.  Suspect no further work-up or treatment is needed, as suspect likely Mobitz I occurring during sleep.  However she does report she has been waking up during the night with bradycardia and lightheadedness, and symptoms appear to be causing distress.  Will refer to EP for evaluation   Time:   Today, I have spent 10 minutes with the  patient with telehealth technology discussing the above problems.     Medication Adjustments/Labs and Tests Ordered: Current medicines are reviewed at length with the patient today.  Concerns regarding medicines are outlined above.   Tests Ordered: Orders Placed This Encounter  Procedures  . Ambulatory referral to Cardiac Electrophysiology    Medication Changes: No orders of the defined types were placed in this encounter.   Follow Up:  In Person in 6 month(s)  Signed, Donato Heinz, MD  09/07/2019 11:14 PM    [facility]

## 2019-09-04 NOTE — Patient Instructions (Signed)
Medication Instructions:  Your physician recommends that you continue on your current medications as directed. Please refer to the Current Medication list given to you today.  Follow-Up: At [facility], you and your health needs are our priority.  As part of our continuing mission to provide you with exceptional heart care, we have created designated Provider Care Teams.  These Care Teams include your primary Cardiologist (physician) and Advanced Practice Providers (APPs -  Physician Assistants and Nurse Practitioners) who all work together to provide you with the care you need, when you need it.  We recommend signing up for the patient portal called "MyChart".  Sign up information is provided on this After Visit Summary.  MyChart is used to connect with patients for Virtual Visits (Telemedicine).  Patients are able to view lab/test results, encounter notes, upcoming appointments, etc.  Non-urgent messages can be sent to your provider as well.   To learn more about what you can do with MyChart, go to [email or web address].    Your next appointment:   6 month(s)  The format for your next appointment:   In Person  Provider:   Oswaldo Milian, MD   Other Instructions You have been referred to: Electrophysiologist at our [facility] ---a scheduler will call you to arrange appointment

## 2019-09-04 NOTE — Telephone Encounter (Signed)
  Patient Consent for Virtual Visit         Cassandra Watts has provided verbal consent on 09/04/2019 for a virtual visit (video or telephone).   CONSENT FOR VIRTUAL VISIT FOR:  Cassandra Watts  By participating in this virtual visit I agree to the following:  I hereby voluntarily request, consent and authorize [facility] and its employed or contracted physicians, physician assistants, nurse practitioners or other licensed health care professionals (the Practitioner), to provide me with telemedicine health care services (the "Services") as deemed necessary by the treating Practitioner. I acknowledge and consent to receive the Services by the Practitioner via telemedicine. I understand that the telemedicine visit will involve communicating with the Practitioner through live audiovisual communication technology and the disclosure of certain medical information by electronic transmission. I acknowledge that I have been given the opportunity to request an in-person assessment or other available alternative prior to the telemedicine visit and am voluntarily participating in the telemedicine visit.  I understand that I have the right to withhold or withdraw my consent to the use of telemedicine in the course of my care at any time, without affecting my right to future care or treatment, and that the Practitioner or I may terminate the telemedicine visit at any time. I understand that I have the right to inspect all information obtained and/or recorded in the course of the telemedicine visit and may receive copies of available information for a reasonable fee.  I understand that some of the potential risks of receiving the Services via telemedicine include:  Marland Kitchen Delay or interruption in medical evaluation due to technological equipment failure or disruption; . Information transmitted may not be sufficient (e.g. poor resolution of images) to allow for appropriate medical decision making by the Practitioner;  and/or  . In rare instances, security protocols could fail, causing a breach of personal health information.  Furthermore, I acknowledge that it is my responsibility to provide information about my medical history, conditions and care that is complete and accurate to the best of my ability. I acknowledge that Practitioner's advice, recommendations, and/or decision may be based on factors not within their control, such as incomplete or inaccurate data provided by me or distortions of diagnostic images or specimens that may result from electronic transmissions. I understand that the practice of medicine is not an exact science and that Practitioner makes no warranties or guarantees regarding treatment outcomes. I acknowledge that a copy of this consent can be made available to me via my patient portal ([facility]), or I can request a printed copy by calling the office of [facility].    I understand that my insurance will be billed for this visit.   I have read or had this consent read to me. . I understand the contents of this consent, which adequately explains the benefits and risks of the Services being provided via telemedicine.  . I have been provided ample opportunity to ask questions regarding this consent and the Services and have had my questions answered to my satisfaction. . I give my informed consent for the services to be provided through the use of telemedicine in my medical care

## 2019-09-15 ENCOUNTER — Ambulatory Visit (INDEPENDENT_AMBULATORY_CARE_PROVIDER_SITE_OTHER): Payer: No Typology Code available for payment source | Admitting: Internal Medicine

## 2019-09-15 ENCOUNTER — Other Ambulatory Visit: Payer: Self-pay

## 2019-09-15 ENCOUNTER — Telehealth: Payer: Self-pay | Admitting: *Deleted

## 2019-09-15 ENCOUNTER — Encounter: Payer: Self-pay | Admitting: Internal Medicine

## 2019-09-15 VITALS — BP 108/88 | HR 59 | Ht 66.0 in | Wt 139.8 lb

## 2019-09-15 DIAGNOSIS — R002 Palpitations: Secondary | ICD-10-CM

## 2019-09-15 DIAGNOSIS — R001 Bradycardia, unspecified: Secondary | ICD-10-CM

## 2019-09-15 NOTE — Telephone Encounter (Signed)
-----   Message from Darrell Jewel, RN sent at 09/15/2019 12:01 PM EDT ----- Regarding: Home sleep study  Patient needs Home sleep study per Dr. Rayann Heman.   Thank you

## 2019-09-15 NOTE — Patient Instructions (Signed)
Medication Instructions:  Your physician recommends that you continue on your current medications as directed. Please refer to the Current Medication list given to you today.  *If you need a refill on your cardiac medications before your next appointment, please call your pharmacy*  Lab Work: None ordered.  If you have labs (blood work) drawn today and your tests are completely normal, you will receive your results only by: Marland Kitchen MyChart Message (if you have MyChart) OR . A paper copy in the mail If you have any lab test that is abnormal or we need to change your treatment, we will call you to review the results.  Testing/Procedures: Your physician has recommended that you have a sleep study. This test records several body functions during sleep, including: brain activity, eye movement, oxygen and carbon dioxide blood levels, heart rate and rhythm, breathing rate and rhythm, the flow of air through your mouth and nose, snoring, body muscle movements, and chest and belly movement.   Follow-Up: At [facility], you and your health needs are our priority.  As part of our continuing mission to provide you with exceptional heart care, we have created designated Provider Care Teams.  These Care Teams include your primary Cardiologist (physician) and Advanced Practice Providers (APPs -  Physician Assistants and Nurse Practitioners) who all work together to provide you with the care you need, when you need it.  We recommend signing up for the patient portal called "MyChart".  Sign up information is provided on this After Visit Summary.  MyChart is used to connect with patients for Virtual Visits (Telemedicine).  Patients are able to view lab/test results, encounter notes, upcoming appointments, etc.  Non-urgent messages can be sent to your provider as well.   To learn more about what you can do with MyChart, go to [email or web address].    Your next appointment:   Your physician wants you to  follow-up in: As needed. You will receive a reminder letter in the mail two months in advance. If you don't receive a letter, please call our office to schedule the follow-up appointment.    Other Instructions:

## 2019-09-15 NOTE — Progress Notes (Signed)
Electrophysiology Office Note   Date:  09/15/2019   ID:  Cassandra Watts, DOB 03/09/1990, MRN [id number]  PCP:  Caren Macadam, MD  Cardiologist:  Dr Gardiner Rhyme Primary Electrophysiologist: Thompson Grayer, MD    CC: bradycardia   History of Present Illness: Cassandra Watts is a 29 y.o. female who presents today for electrophysiology evaluation.   She is referred by Dr Gardiner Rhyme for EP consultation regarding bradycadria. She has a occasional palpitations since April.  She describes these as always at night.  She wakes with a startle and notices that she cannot feel her pulse.  This has happened 3 times since April.  She finds that she is washed out the following day. She does not have these palpitations during the day.  She denies tachypalpitations.  She  Had a zio patch placed which showed transient 2:1 AV block (nocturnal). No day time AV block was observed.  Today, she denies symptoms of  chest pain, shortness of breath, orthopnea, PND, lower extremity edema, claudication, dizziness, presyncope, syncope, bleeding, or neurologic sequela. The patient is tolerating medications without difficulties and is otherwise without complaint today.    Past Medical History:  Diagnosis Date  . Dermatitis   . Hematometra    08-22-2019 per pt no menses since cervial conization surgery 06-04-2019, stated told her cervix is closed due to stenosis, she having abd. bloating/ pain/ cramping  . History of cervical cancer    SCC , Stage IA2----s/p  LEEP 12/ 2020 with positive margin's;  06-04-2019 s/p cold knife cervical conziation negative margin's  . IDA (iron deficiency anemia)   . Palpitations    08-22-2019 evaluation by cardiology, dr Oswaldo Milian, note in epic 07-17-2019;  echo and Zio 14 day monitor ordered;  echo done 08-14-2019 normal with ef 60-65% and pt stated completed ZIO monitor and mailed back approx. 08-14-2019     Past Surgical History:  Procedure Laterality Date  . BREAST BIOPSY Right  2013   benign growth  . CERVICAL BIOPSY  W/ LOOP ELECTRODE EXCISION  01-10-2019   sedation in  GYN office  . CERVICAL CONIZATION W/BX  06-04-2019  [facility]  in [place]  . DILATION AND CURETTAGE OF UTERUS N/A 08/27/2019   Procedure: DILATATION AND CURETTAGE OF UTERUS;  Surgeon: Lafonda Mosses, MD;  Location: [facility];  Service: Gynecology;  Laterality: N/A;     Current Outpatient Medications  Medication Sig Dispense Refill  . ferrous sulfate 325 (65 FE) MG tablet Take 325 mg by mouth daily with breakfast.     . ibuprofen (ADVIL) 200 MG tablet Take 200 mg by mouth every 6 (six) hours as needed.     No current facility-administered medications for this visit.    Allergies:   Oxycodone and Penicillins   Social History:  The patient  reports that she quit smoking about 9 years ago. Her smoking use included cigarettes. She has a 1.00 pack-year smoking history. She has never used smokeless tobacco. She reports previous alcohol use. She reports that she does not use drugs.   Family History:  The patient's family history includes Cancer in her maternal aunt, mother, and sister.    ROS:  Please see the history of present illness.   All other systems are personally reviewed and negative.    PHYSICAL EXAM: VS:  BP 108/88   Pulse (!) 59   Ht 5\' 6"  (1.676 m)   Wt 139 lb 12.8 oz (63.4 kg)   SpO2 98%  BMI 22.56 kg/m  , BMI Body mass index is 22.56 kg/m. GEN: Well nourished, well developed, in no acute distress HEENT: normal Neck: no JVD  Cardiac: RRR  Respiratory:   normal work of breathing GI: soft  MS: no deformity or atrophy Skin: warm and dry  Neuro:  Strength and sensation are intact Psych: euthymic mood, full affect  EKG:  EKG is ordered today. The ekg ordered today is personally reviewed and shows sinus rhythm 59 bpm, PR 142 msec, QTc 433 msec   Recent Labs: 08/20/2019: ALT 11; BUN 9; Creatinine, Ser 0.55; Hemoglobin 12.2; Platelets 185;  Potassium 4.0; Sodium 138  personally reviewed   Lipid Panel  No results found for: CHOL, TRIG, HDL, CHOLHDL, VLDL, LDLCALC, LDLDIRECT personally reviewed   Wt Readings from Last 3 Encounters:  09/15/19 139 lb 12.8 oz (63.4 kg)  09/04/19 140 lb (63.5 kg)  08/27/19 140 lb 14.4 oz (63.9 kg)    Other studies personally reviewed: Additional studies/ records that were reviewed today include: prior echo,  Dr Newman Nickels notes, Zio monitor  Review of the above records today demonstrates: as above   ASSESSMENT AND PLAN:  1.  Palpitations I have reviewed her event monitor personally and at length.  Her daytime heart rates are preserved (average HR 68 bpm) with good HR variability. She did have nocturnal bradycardia with PP prolongation associated with transient AV block which is a normal finding and consistent with increased vagal tone. No indication for pacing I would advise sleep study.  No further EP workup is advised.  Risks, benefits and potential toxicities for medications prescribed and/or refilled reviewed with patient today.    Follow-up:  Return as needed  Current medicines are reviewed at length with the patient today.   The patient does not have concerns regarding her medicines.  The following changes were made today:  none  Labs/ tests ordered today include:  No orders of the defined types were placed in this encounter.    Army Fossa, MD  09/15/2019 11:38 AM     [facility] [address] [place] [address] [phone] (office) [phone] (fax)

## 2019-09-16 ENCOUNTER — Telehealth: Payer: Self-pay | Admitting: *Deleted

## 2019-09-16 NOTE — Telephone Encounter (Signed)
Staff message sent to Gae Bon ok to schedule HST. No PA required.

## 2019-09-16 NOTE — Telephone Encounter (Signed)
Patient is aware and agreeable to Home Sleep Study through [facility]. Patient is scheduled for 10/4 at 1:30 to pick up home sleep kit and meet with Respiratory therapist at [facility]. Patient is aware that if this appointment date and time does not work for them they should contact [facility] directly at [phone]. Patient is aware that a sleep packet will be sent from  Sleep Center in week. Patient is agreeable to treatment and thankful for call.  

## 2019-09-16 NOTE — Progress Notes (Signed)
Gynecologic Oncology Return Clinic Visit  09/17/2019  Reason for Visit: Postoperative follow-up as well as early stage cervical cancer surveillance  Treatment History: Oncology History Overview Note  Dr. Earlie Counts - initial referring physician    Cervix cancer [facility])  01/10/2019 Surgery   LEEP: IA2 SCC of the cervix, no LVSI   02/28/2019 Imaging   PET/CT: negative for metastatic disease   06/04/2019 Surgery   CKC: no residual cancer   08/22/2019 Initial Diagnosis   Cervix cancer ([place])     Interval History: Patient is now several weeks status post cervical dilation and gentle endometrial curettage in the setting of cervical stenosis and hematometra that developed after her cold knife cone for early stage cervical cancer.  She reports doing very well since surgery.  She had minimal bleeding that required 1 day of wearing normal pads and then several days of panty liners.  Her normal menses should come sometime next week.  She denies any significant pain or cramping after surgery.  She occasionally is awakened at night for pelvic cramping that resolves without any medication.  She endorses a good diet without any nausea or emesis.  She reports regular bowel function.  Past Medical/Surgical History: Past Medical History:  Diagnosis Date  . Dermatitis   . Hematometra    08-22-2019 per pt no menses since cervial conization surgery 06-04-2019, stated told her cervix is closed due to stenosis, she having abd. bloating/ pain/ cramping  . History of cervical cancer    SCC , Stage IA2----s/p  LEEP 12/ 2020 with positive margin's;  06-04-2019 s/p cold knife cervical conziation negative margin's  . IDA (iron deficiency anemia)   . Palpitations    08-22-2019 evaluation by cardiology, dr Oswaldo Milian, note in epic 07-17-2019;  echo and Zio 14 day monitor ordered;  echo done 08-14-2019 normal with ef 60-65% and pt stated completed ZIO monitor and mailed back approx. 08-14-2019       Past Surgical History:  Procedure Laterality Date  . BREAST BIOPSY Right 2013   benign growth  . CERVICAL BIOPSY  W/ LOOP ELECTRODE EXCISION  01-10-2019   sedation in  GYN office  . CERVICAL CONIZATION W/BX  06-04-2019  [facility]  in [place]  . DILATION AND CURETTAGE OF UTERUS N/A 08/27/2019   Procedure: DILATATION AND CURETTAGE OF UTERUS;  Surgeon: Lafonda Mosses, MD;  Location: [facility];  Service: Gynecology;  Laterality: N/A;    Family History  Problem Relation Age of Onset  . Cancer Mother        possible uterine cancer (abnormal cells)  . Cancer Sister        abnormal cells  . Cancer Maternal Aunt        breast    Social History   Socioeconomic History  . Marital status: Married    Spouse name: Not on file  . Number of children: Not on file  . Years of education: Not on file  . Highest education level: Not on file  Occupational History  . Not on file  Tobacco Use  . Smoking status: Former Smoker    Packs/day: 1.00    Years: 1.00    Pack years: 1.00    Types: Cigarettes    Quit date: 08/22/2010    Years since quitting: 9.0  . Smokeless tobacco: Never Used  Vaping Use  . Vaping Use: Former  . Quit date: 08/22/2011  Substance and Sexual Activity  . Alcohol use: Not Currently  .  Drug use: Never  . Sexual activity: Not on file  Other Topics Concern  . Not on file  Social History Narrative  . Not on file   Social Determinants of Health   Financial Resource Strain:   . Difficulty of Paying Living Expenses:   Food Insecurity:   . Worried About Charity fundraiser in the Last Year:   . Arboriculturist in the Last Year:   Transportation Needs:   . Film/video editor (Medical):   Marland Kitchen Lack of Transportation (Non-Medical):   Physical Activity:   . Days of Exercise per Week:   . Minutes of Exercise per Session:   Stress:   . Feeling of Stress :   Social Connections:   . Frequency of Communication with Friends and Family:   .  Frequency of Social Gatherings with Friends and Family:   . Attends Religious Services:   . Active Member of Clubs or Organizations:   . Attends Archivist Meetings:   Marland Kitchen Marital Status:     Current Medications:  Current Outpatient Medications:  .  ferrous sulfate 325 (65 FE) MG tablet, Take 325 mg by mouth daily with breakfast. , Disp: , Rfl:  .  ibuprofen (ADVIL) 200 MG tablet, Take 200 mg by mouth every 6 (six) hours as needed., Disp: , Rfl:   Review of Systems: Denies appetite changes, fevers, chills, fatigue, unexplained weight changes. Denies hearing loss, neck lumps or masses, mouth sores, ringing in ears or voice changes. Denies cough or wheezing.  Denies shortness of breath. Denies chest pain or palpitations. Denies leg swelling. Denies abdominal distention, pain, blood in stools, constipation, diarrhea, nausea, vomiting, or early satiety. Denies pain with intercourse, dysuria, frequency, hematuria or incontinence. Denies hot flashes, pelvic pain, vaginal bleeding or vaginal discharge.   Denies joint pain, back pain or muscle pain/cramps. Denies itching, rash, or wounds. Denies dizziness, headaches, numbness or seizures. Denies swollen lymph nodes or glands, denies easy bruising or bleeding. Denies anxiety, depression, confusion, or decreased concentration.  Physical Exam: BP 102/74 (BP Location: Left Arm, Patient Position: Sitting)   Pulse 66   Temp 98.4 F (36.9 C) (Oral)   Resp 16   Ht 5\' 6"  (1.676 m)   Wt 138 lb 8 oz (62.8 kg)   SpO2 100%   BMI 22.35 kg/m  General: Alert, oriented, no acute distress. HEENT: Normocephalic, atraumatic, sclera anicteric. Chest: Unlabored breathing on room air. Abdomen: soft, nontender.   No masses or hepatosplenomegaly appreciated.   Extremities: Grossly normal range of motion.  Warm, well perfused.  No edema bilaterally. Skin: No rashes or lesions noted. GU: Normal appearing external genitalia without erythema,  excoriation, or lesions.  Speculum exam reveals cervix well-healed with what looks to be cannulated os, no masses or lesions.  Bimanual exam reveals mobile uterus, cervix without firmness or nodularity.  Rectovaginal exam deferred given recent exam in the operating room.  Laboratory & Radiologic Studies: None new  Assessment & Plan: Cassandra Watts is a 29 y.o. 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  The patient has done exceptionally well after surgery.  Her anticipated next menses is sometime next week.  She will let me know either by phone call or MyChart if she has bleeding.  On her  exam, her os looks cannulated and I suspect she will have a normal.  menses.  We discussed surveillance guidelines in the setting of her early stage cervical cancer.  Per NCCN surveillance recommendations, we would plan to have visits every 3-6 months for 2 years.  She had talked to her previous provider about visits every 3 months.  I will see her in 3 months time.  We discussed symptoms and signs that would be concerning for disease recurrence and she knows to call me to be seen sooner if she develops any of these.  22 minutes of total time was spent for this patient encounter, including preparation, face-to-face counseling with the patient and coordination of care, and documentation of the encounter.  Jeral Pinch, MD  Division of Gynecologic Oncology  Department of Obstetrics and Gynecology  [facility]

## 2019-09-17 ENCOUNTER — Other Ambulatory Visit: Payer: Self-pay

## 2019-09-17 ENCOUNTER — Inpatient Hospital Stay: Payer: No Typology Code available for payment source | Attending: Gynecologic Oncology | Admitting: Gynecologic Oncology

## 2019-09-17 VITALS — BP 102/74 | HR 66 | Temp 98.4°F | Resp 16 | Ht 66.0 in | Wt 138.5 lb

## 2019-09-17 DIAGNOSIS — C539 Malignant neoplasm of cervix uteri, unspecified: Secondary | ICD-10-CM | POA: Diagnosis present

## 2019-09-17 NOTE — Patient Instructions (Signed)
Please let me know what happens with your period next week.  Your exam is overall very reassuring today.  I will see you in 3 months.  If you develop any new new symptoms, please call the clinic to be seen sooner at [phone].

## 2019-10-09 ENCOUNTER — Ambulatory Visit: Payer: No Typology Code available for payment source | Admitting: Cardiology

## 2019-10-14 ENCOUNTER — Encounter: Payer: Self-pay | Admitting: Gynecologic Oncology

## 2019-11-10 ENCOUNTER — Encounter (HOSPITAL_BASED_OUTPATIENT_CLINIC_OR_DEPARTMENT_OTHER): Payer: No Typology Code available for payment source | Admitting: Cardiovascular Disease

## 2019-12-18 ENCOUNTER — Telehealth: Payer: Self-pay

## 2019-12-18 NOTE — Progress Notes (Signed)
Gynecologic Oncology Return Clinic Visit  12/19/19   Reason for Visit: surveillance in the setting of early-stage cervical cancer treated with fertility sparing ckc  Treatment History: Oncology History Overview Note  Dr. Earlie Counts - initial referring physician    Cervix cancer [facility])  01/10/2019 Surgery   LEEP: IA2 SCC of the cervix, no LVSI   02/28/2019 Imaging   PET/CT: negative for metastatic disease   06/04/2019 Surgery   CKC: no residual cancer   08/22/2019 Initial Diagnosis   Cervix cancer ([place])     Interval History: Patient has been doing well since her last visit with me.  She missed her menses the month after surgery but notes having 1 in late September into early October.  Unlike her normal menses when she has some morning, the swelling came on quite suddenly.  It lasted shorter time than normal.  She denies any abdominal or pelvic pain and denies any cramping.  She is having some breast tenderness which is a symptom that she typically gets before starting her menses.  She denies any intermenstrual bleeding or discharge.  She endorses a good appetite without nausea or emesis.  She reports normal bowel and bladder function.  Past Medical/Surgical History: Past Medical History:  Diagnosis Date  . Dermatitis   . Hematometra    08-22-2019 per pt no menses since cervial conization surgery 06-04-2019, stated told her cervix is closed due to stenosis, she having abd. bloating/ pain/ cramping  . History of cervical cancer    SCC , Stage IA2----s/p  LEEP 12/ 2020 with positive margin's;  06-04-2019 s/p cold knife cervical conziation negative margin's  . IDA (iron deficiency anemia)   . Palpitations    08-22-2019 evaluation by cardiology, dr Oswaldo Milian, note in epic 07-17-2019;  echo and Zio 14 day monitor ordered;  echo done 08-14-2019 normal with ef 60-65% and pt stated completed ZIO monitor and mailed back approx. 08-14-2019      Past Surgical History:   Procedure Laterality Date  . BREAST BIOPSY Right 2013   benign growth  . CERVICAL BIOPSY  W/ LOOP ELECTRODE EXCISION  01-10-2019   sedation in  GYN office  . CERVICAL CONIZATION W/BX  06-04-2019  [facility]  in [place]  . DILATION AND CURETTAGE OF UTERUS N/A 08/27/2019   Procedure: DILATATION AND CURETTAGE OF UTERUS;  Surgeon: Lafonda Mosses, MD;  Location: [facility];  Service: Gynecology;  Laterality: N/A;    Family History  Problem Relation Age of Onset  . Cancer Mother        possible uterine cancer (abnormal cells)  . Cancer Sister        abnormal cells  . Cancer Maternal Aunt        breast    Social History   Socioeconomic History  . Marital status: Married    Spouse name: Not on file  . Number of children: Not on file  . Years of education: Not on file  . Highest education level: Not on file  Occupational History  . Not on file  Tobacco Use  . Smoking status: Former Smoker    Packs/day: 1.00    Years: 1.00    Pack years: 1.00    Types: Cigarettes    Quit date: 08/22/2010    Years since quitting: 9.3  . Smokeless tobacco: Never Used  Vaping Use  . Vaping Use: Former  . Quit date: 08/22/2011  Substance and Sexual Activity  . Alcohol use: Not  Currently  . Drug use: Never  . Sexual activity: Not on file  Other Topics Concern  . Not on file  Social History Narrative  . Not on file   Social Determinants of Health   Financial Resource Strain:   . Difficulty of Paying Living Expenses: Not on file  Food Insecurity:   . Worried About Charity fundraiser in the Last Year: Not on file  . Ran Out of Food in the Last Year: Not on file  Transportation Needs:   . Lack of Transportation (Medical): Not on file  . Lack of Transportation (Non-Medical): Not on file  Physical Activity:   . Days of Exercise per Week: Not on file  . Minutes of Exercise per Session: Not on file  Stress:   . Feeling of Stress : Not on file  Social Connections:    . Frequency of Communication with Friends and Family: Not on file  . Frequency of Social Gatherings with Friends and Family: Not on file  . Attends Religious Services: Not on file  . Active Member of Clubs or Organizations: Not on file  . Attends Archivist Meetings: Not on file  . Marital Status: Not on file    Current Medications:  Current Outpatient Medications:  .  ferrous sulfate 325 (65 FE) MG tablet, Take 325 mg by mouth daily with breakfast. , Disp: , Rfl:  .  ibuprofen (ADVIL) 200 MG tablet, Take 200 mg by mouth every 6 (six) hours as needed. (Patient not taking: Reported on 12/19/2019), Disp: , Rfl:   Review of Systems: Denies appetite changes, fevers, chills, fatigue, unexplained weight changes. Denies hearing loss, neck lumps or masses, mouth sores, ringing in ears or voice changes. Denies cough or wheezing.  Denies shortness of breath. Denies chest pain or palpitations. Denies leg swelling. Denies abdominal distention, pain, blood in stools, constipation, diarrhea, nausea, vomiting, or early satiety. Denies pain with intercourse, dysuria, frequency, hematuria or incontinence. Denies hot flashes, pelvic pain, vaginal bleeding or vaginal discharge.   Denies joint pain, back pain or muscle pain/cramps. Denies itching, rash, or wounds. Denies dizziness, headaches, numbness or seizures. Denies swollen lymph nodes or glands, denies easy bruising or bleeding. Denies anxiety, depression, confusion, or decreased concentration.  Physical Exam: BP 122/88 (BP Location: Right Arm, Patient Position: Sitting)   Pulse 76   Temp 98.2 F (36.8 C) (Tympanic)   Resp 16   Ht 5\' 6"  (1.676 m)   Wt 133 lb (60.3 kg)   SpO2 100% Comment: RA  BMI 21.47 kg/m  General: Alert, oriented, no acute distress. HEENT: Normocephalic, atraumatic, sclera anicteric. Chest: Unlabored breathing on room air. Abdomen: soft, nontender.  Normoactive bowel sounds.  No masses or hepatosplenomegaly  appreciated.  Extremities: Grossly normal range of motion.  Warm, well perfused.  No edema bilaterally. Skin: No rashes or lesions noted. Lymphatics: No cervical, supraclavicular, or inguinal adenopathy. GU: Normal appearing external genitalia without erythema, excoriation, or lesions.  Speculum exam reveals well rugated vaginal mucosa, cervix with evidence of prior cold knife cone but no lesions noted.  Cytobrush used and easily passes 2-3 cm into the cervical os.  Bimanual exam reveals small mobile uterus, no adnexal masses, no nodularity of the cervix or within the rectovaginal septum.  Rectovaginal exam confirms findings.  Laboratory & Radiologic Studies: None new  Assessment & Plan: Cassandra Watts is a 29 y.o. 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  Patient is overall doing well and without evidence of disease on today's exam.  She has had 1 menstrual cycle since surgery and thinks she may be about to start her menses again.  She has not had recurrence of any of the symptoms such as pelvic pain and bloating that she was having when she presented with hematometra after multiple months of no vaginal bleeding.  We discussed surveillance guidelines in the setting of her early stage cervical cancer.  Per NCCN surveillance recommendations, we would plan to have visits every 3-6 months for 2 years.  She had talked to her previous provider about visits every 3 months.    Given a busy February, we will plan for her next surveillance visit in 4 months (March). We discussed symptoms and signs that would be concerning for disease recurrence and she knows to call me to be seen sooner if she develops any of these.  30 minutes of total time was spent for this patient encounter, including preparation, face-to-face counseling with the patient and coordination of care, and documentation of the  encounter.  Jeral Pinch, MD  Division of Gynecologic Oncology  Department of Obstetrics and Gynecology  [facility]

## 2019-12-18 NOTE — Telephone Encounter (Signed)
TC to patient to review meaningful use questions for tomorrow's visit.  No answer, left message to return call.

## 2019-12-19 ENCOUNTER — Other Ambulatory Visit: Payer: Self-pay

## 2019-12-19 ENCOUNTER — Encounter: Payer: Self-pay | Admitting: Gynecologic Oncology

## 2019-12-19 ENCOUNTER — Inpatient Hospital Stay: Payer: No Typology Code available for payment source | Attending: Gynecologic Oncology | Admitting: Gynecologic Oncology

## 2019-12-19 VITALS — BP 122/88 | HR 76 | Temp 98.2°F | Resp 16 | Ht 66.0 in | Wt 133.0 lb

## 2019-12-19 DIAGNOSIS — Z87891 Personal history of nicotine dependence: Secondary | ICD-10-CM | POA: Diagnosis not present

## 2019-12-19 DIAGNOSIS — L309 Dermatitis, unspecified: Secondary | ICD-10-CM | POA: Diagnosis not present

## 2019-12-19 DIAGNOSIS — C539 Malignant neoplasm of cervix uteri, unspecified: Secondary | ICD-10-CM | POA: Diagnosis not present

## 2019-12-19 DIAGNOSIS — N857 Hematometra: Secondary | ICD-10-CM | POA: Insufficient documentation

## 2019-12-19 NOTE — Patient Instructions (Signed)
Everything looks normal on your exam. I will see you in March for your next visit. Please call the clinic in the next month to schedule that visit. If you develop any symptoms before then (bleeding between periods, pain), please call [phone].

## 2019-12-24 ENCOUNTER — Telehealth: Payer: Self-pay | Admitting: Cardiology

## 2019-12-24 NOTE — Telephone Encounter (Signed)
Spoke with patient about scheduling follow up with Dr. Gardiner Rhyme, she stated she didn't need to see him anymore

## 2020-12-08 ENCOUNTER — Telehealth: Payer: Self-pay | Admitting: Oncology

## 2020-12-08 NOTE — Telephone Encounter (Signed)
[facility] and scheduled follow up appointment with Dr. Berline Lopes on 12/22/20 at 3:30.

## 2020-12-21 ENCOUNTER — Encounter: Payer: Self-pay | Admitting: Gynecologic Oncology

## 2020-12-22 ENCOUNTER — Other Ambulatory Visit: Payer: Self-pay

## 2020-12-22 ENCOUNTER — Encounter: Payer: Self-pay | Admitting: Gynecologic Oncology

## 2020-12-22 ENCOUNTER — Inpatient Hospital Stay: Payer: No Typology Code available for payment source | Attending: Gynecologic Oncology | Admitting: Gynecologic Oncology

## 2020-12-22 VITALS — BP 113/69 | HR 55 | Temp 97.2°F | Resp 16 | Wt 137.5 lb

## 2020-12-22 DIAGNOSIS — Z9889 Other specified postprocedural states: Secondary | ICD-10-CM | POA: Insufficient documentation

## 2020-12-22 DIAGNOSIS — D509 Iron deficiency anemia, unspecified: Secondary | ICD-10-CM | POA: Diagnosis not present

## 2020-12-22 DIAGNOSIS — Z8541 Personal history of malignant neoplasm of cervix uteri: Secondary | ICD-10-CM | POA: Insufficient documentation

## 2020-12-22 DIAGNOSIS — C539 Malignant neoplasm of cervix uteri, unspecified: Secondary | ICD-10-CM

## 2020-12-22 NOTE — Patient Instructions (Signed)
It was great to see you today!  I do not see or feel any evidence of cancer on my exam.  I recommend that we continue with visits every 6 months for another year.  Once you are 3 years out from diagnosis, I would feel comfortable transitioning to yearly visits.  Please call to come see me sooner if you have any new symptoms.

## 2020-12-22 NOTE — Progress Notes (Signed)
Gynecologic Oncology Return Clinic Visit  12/22/20  Reason for Visit: surveillance in the setting of early-stage cervical cancer treated with fertility sparing ckc  Treatment History: Oncology History Overview Note  Dr. Earlie Counts - initial referring physician    Cervix cancer [facility])  01/10/2019 Surgery   LEEP: IA2 SCC of the cervix, no LVSI   02/28/2019 Imaging   PET/CT: negative for metastatic disease   06/04/2019 Surgery   CKC: no residual cancer   08/22/2019 Initial Diagnosis   Cervix cancer ([facility])     Interval History: Patient presents today for surveillance visit.  She notes overall doing well.  She has not had any menstrual issues since her procedure last summer.  She notes that periods come about every 35 days, denies any intermenstrual bleeding or skipping menses.  Denies any abdominal or pelvic pain.  Reports normal bowel and bladder function.  She and her husband bought a new house and renovated it over the summer.  Saw her OB/GYN about a month ago, Dr. Ernestina Patches, had a Pap smear at that visit.  Reports that this was normal.  Past Medical/Surgical History: Past Medical History:  Diagnosis Date   Dermatitis    Hematometra    08-22-2019 per pt no menses since cervial conization surgery 06-04-2019, stated told her cervix is closed due to stenosis, she having abd. bloating/ pain/ cramping   History of cervical cancer    SCC , Stage IA2----s/p  LEEP 12/ 2020 with positive margin's;  06-04-2019 s/p cold knife cervical conziation negative margin's   IDA (iron deficiency anemia)    Palpitations    08-22-2019 evaluation by cardiology, dr Oswaldo Milian, note in epic 07-17-2019;  echo and Zio 14 day monitor ordered;  echo done 08-14-2019 normal with ef 60-65% and pt stated completed ZIO monitor and mailed back approx. 08-14-2019      Past Surgical History:  Procedure Laterality Date   BREAST BIOPSY Right 2013   benign growth   CERVICAL BIOPSY  W/ LOOP ELECTRODE  EXCISION  01-10-2019   sedation in  GYN office   CERVICAL CONIZATION W/BX  06-04-2019  [facility]  in [place] N/A 08/27/2019   Procedure: DILATATION AND CURETTAGE OF UTERUS;  Surgeon: Lafonda Mosses, MD;  Location: [facility];  Service: Gynecology;  Laterality: N/A;    Family History  Problem Relation Age of Onset   Cancer Mother        possible uterine cancer (abnormal cells)   Cancer Sister        abnormal cells   Cancer Maternal Aunt        breast    Social History   Socioeconomic History   Marital status: Married    Spouse name: Not on file   Number of children: Not on file   Years of education: Not on file   Highest education level: Not on file  Occupational History   Not on file  Tobacco Use   Smoking status: Former    Packs/day: 1.00    Years: 1.00    Pack years: 1.00    Types: Cigarettes    Quit date: 08/22/2010    Years since quitting: 10.3   Smokeless tobacco: Never  Vaping Use   Vaping Use: Former   Quit date: 08/22/2011  Substance and Sexual Activity   Alcohol use: Not Currently   Drug use: Never   Sexual activity: Not on file  Other Topics Concern  Not on file  Social History Narrative   Not on file   Social Determinants of Health   Financial Resource Strain: Not on file  Food Insecurity: Not on file  Transportation Needs: Not on file  Physical Activity: Not on file  Stress: Not on file  Social Connections: Not on file    Current Medications:  Current Outpatient Medications:    acetaminophen (TYLENOL) 325 MG tablet, Take 650 mg by mouth every 6 (six) hours as needed for mild pain., Disp: , Rfl:    amoxicillin (AMOXIL) 500 MG capsule, Take 500 mg by mouth 3 (three) times daily., Disp: , Rfl:    ferrous sulfate 325 (65 FE) MG tablet, Take 325 mg by mouth daily with breakfast.  (Patient not taking: Reported on 12/21/2020), Disp: , Rfl:    ibuprofen (ADVIL) 200 MG tablet, Take 200 mg by  mouth every 6 (six) hours as needed. (Patient not taking: No sig reported), Disp: , Rfl:   Review of Systems: Denies appetite changes, fevers, chills, fatigue, unexplained weight changes. Denies hearing loss, neck lumps or masses, mouth sores, ringing in ears or voice changes. Denies cough or wheezing.  Denies shortness of breath. Denies chest pain or palpitations. Denies leg swelling. Denies abdominal distention, pain, blood in stools, constipation, diarrhea, nausea, vomiting, or early satiety. Denies pain with intercourse, dysuria, frequency, hematuria or incontinence. Denies hot flashes, pelvic pain, vaginal bleeding or vaginal discharge.   Denies joint pain, back pain or muscle pain/cramps. Denies itching, rash, or wounds. Denies dizziness, headaches, numbness or seizures. Denies swollen lymph nodes or glands, denies easy bruising or bleeding. Denies anxiety, depression, confusion, or decreased concentration.  Physical Exam: BP 113/69 (BP Location: Left Arm, Patient Position: Sitting)   Pulse (!) 55   Temp (!) 97.2 F (36.2 C)   Resp 16   Wt 137 lb 8 oz (62.4 kg)   SpO2 100%   BMI 22.19 kg/m  General: Alert, oriented, no acute distress. HEENT: Normocephalic, atraumatic, sclera anicteric. Chest: Clear to auscultation bilaterally.  No wheezes or rhonchi. Cardiovascular: Regular rate and rhythm, no murmurs. Abdomen: soft, nontender.  Normoactive bowel sounds.  No masses or hepatosplenomegaly appreciated.   Extremities: Grossly normal range of motion.  Warm, well perfused.  No edema bilaterally. Skin: No rashes or lesions noted. Lymphatics: No cervical, supraclavicular, or inguinal adenopathy. GU: Normal appearing external genitalia without erythema, excoriation, or lesions.  Speculum exam reveals minimal blood in the vaginal vault, cervix normal in appearance with sequelae of prior excisional procedures.  Bimanual exam reveals cervix normal, no masses or nodularity appreciated.   Rectovaginal exam confirms these findings.  Laboratory & Radiologic Studies: None new  Assessment & Plan: Cassandra Watts is a 30 y.o. 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  Patient is doing very well without evidence of disease.  She had a recent Pap smear that was normal.  She has had no recurrence of her menstrual cycle issues related to stenosis of the cervical canal.  We have previously discussed visits every 3 to 6 months for 2 years per NCCN surveillance recommendations.  She will be 2 years out from diagnosis next month.  Given her conservative treatment, I recommended that we plan on visits every 6 months for another year.  When she is 3 years out from diagnosis, I would feel comfortable to transitioning to yearly visits.  She should continue to have a yearly Pap test.  We reviewed signs and symptoms that should prompt a phone call to be seen sooner than her next scheduled visit.   32 minutes of total time was spent for this patient encounter, including preparation, face-to-face counseling with the patient and coordination of care, and documentation of the encounter.  Jeral Pinch, MD  Division of Gynecologic Oncology  Department of Obstetrics and Gynecology  [facility]

## 2021-06-09 ENCOUNTER — Encounter: Payer: Self-pay | Admitting: Gynecologic Oncology

## 2021-06-13 ENCOUNTER — Inpatient Hospital Stay: Payer: Medicaid Other | Admitting: Gynecologic Oncology

## 2021-07-04 IMAGING — CR DG CHEST 2V
2 series · 2 of 2 positions shown · non-contrast
Comparison: None.

CLINICAL DATA: Pleuritic chest pain, posterior chest pain

EXAM:
CHEST - 2 VIEW

[w chest pa]
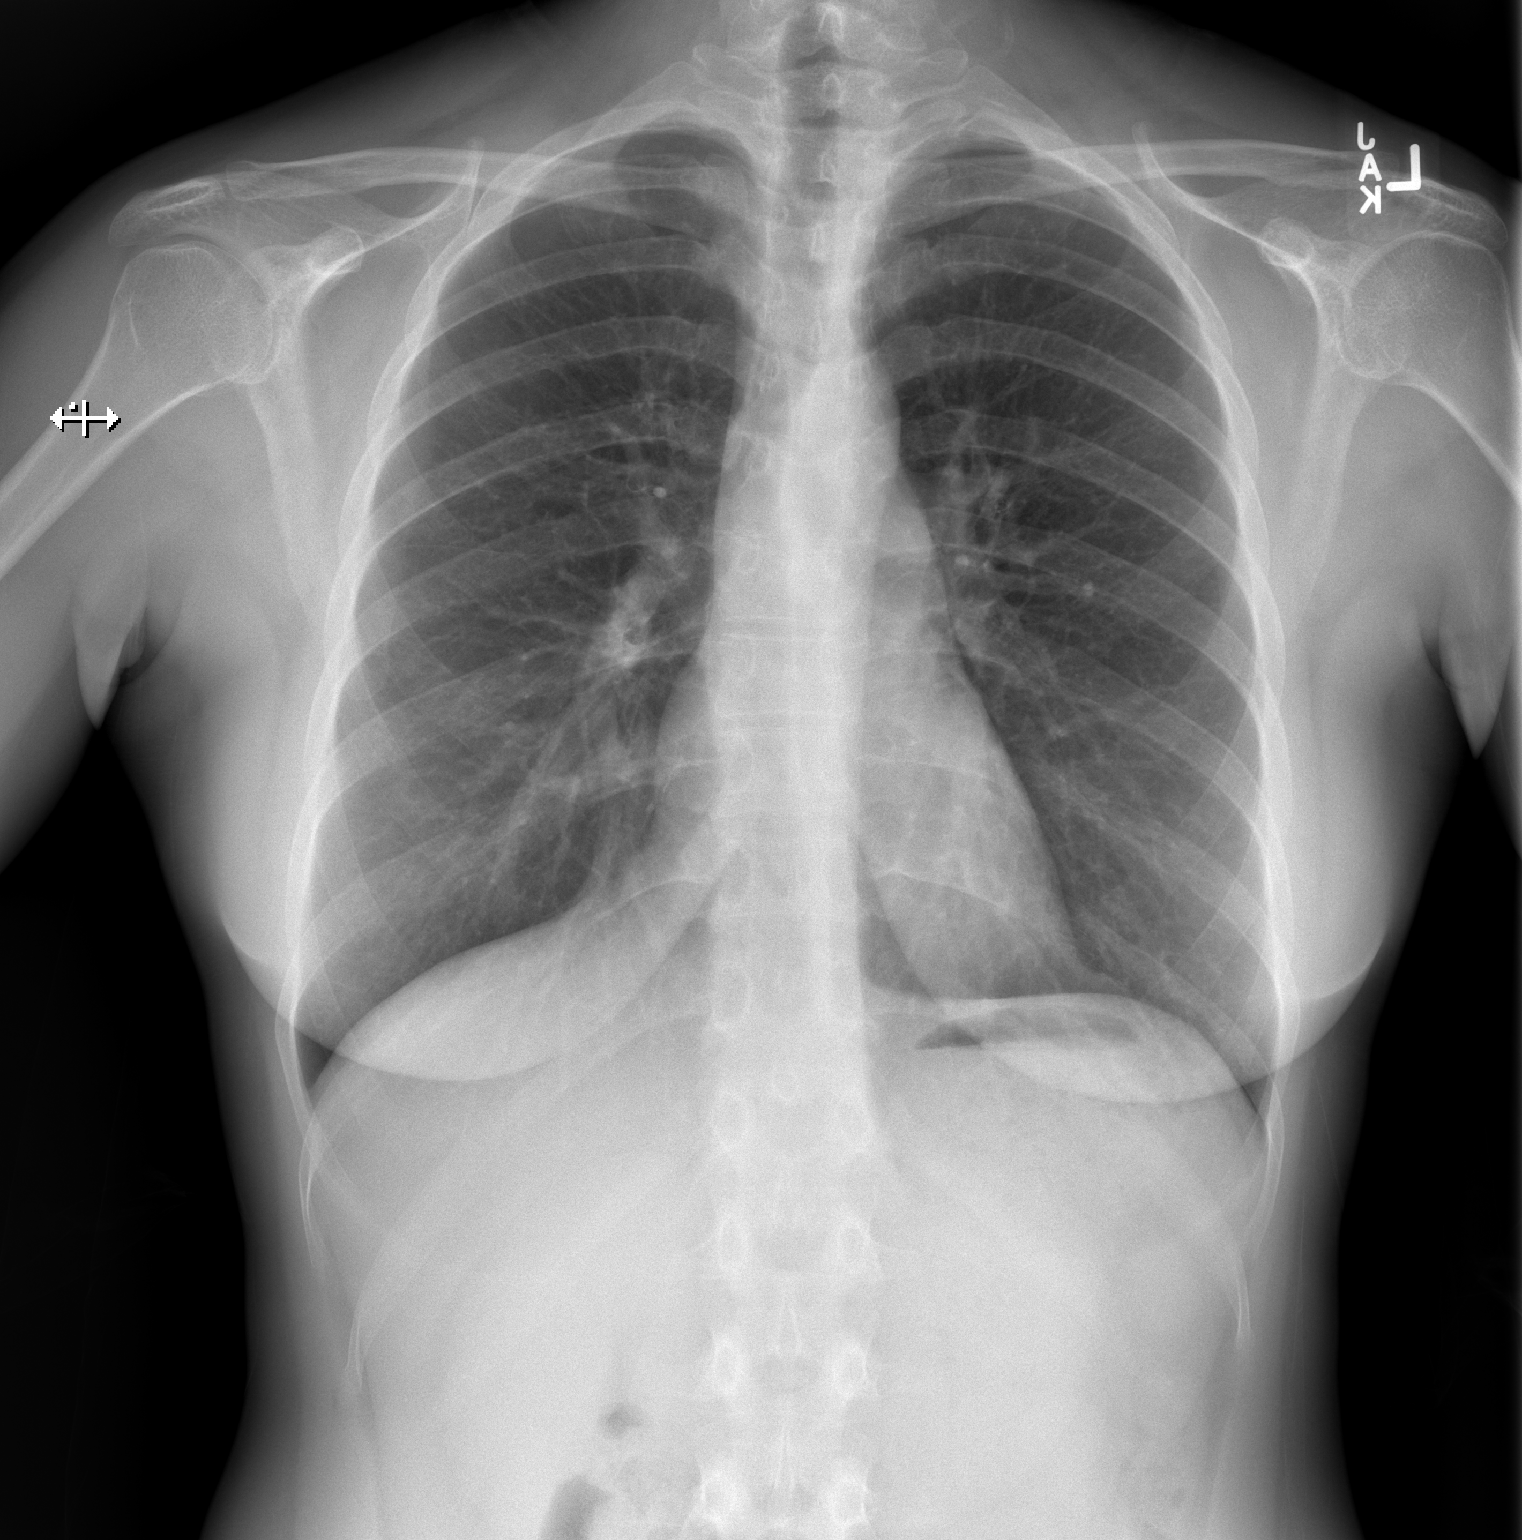

[w chest lat]
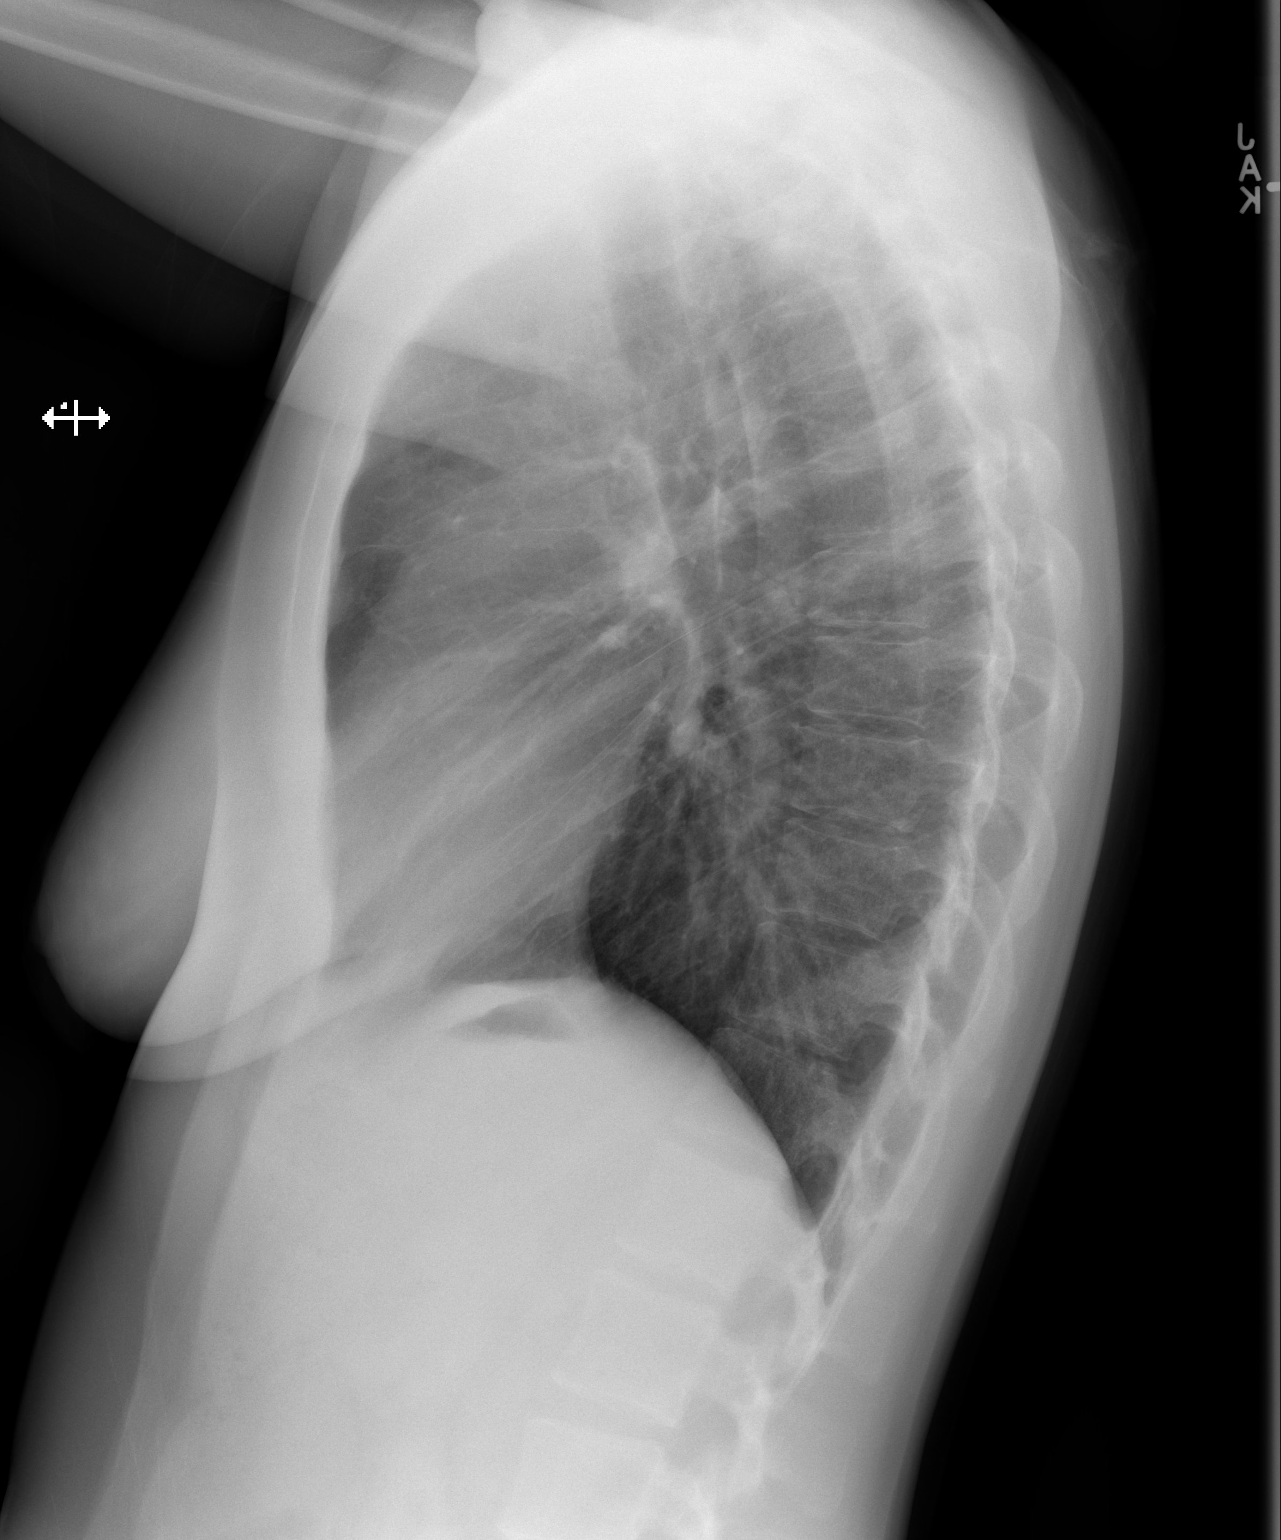

[2 of 2 positions shown; findings below may reference images not displayed]

FINDINGS: The heart size and mediastinal contours are within normal limits.
Both lungs are clear. The visualized skeletal structures are
unremarkable.
IMPRESSION: Normal study.

## 2021-07-12 IMAGING — US US PELVIS COMPLETE TRANSABD/TRANSVAG W DUPLEX
1 series · 13 of 25 positions shown · non-contrast
Comparison: None

CLINICAL DATA: Worsening left lower quadrant pain 1 day LEEP



[Series 1: us pelvis complete transabd/transvag w duplex · 141 acquisitions, 13 frames shown]
[im 1/141]
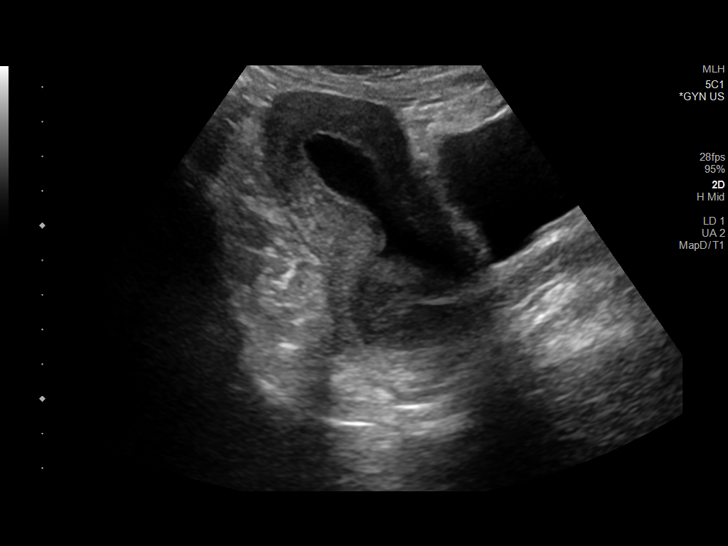
[im 12/141]
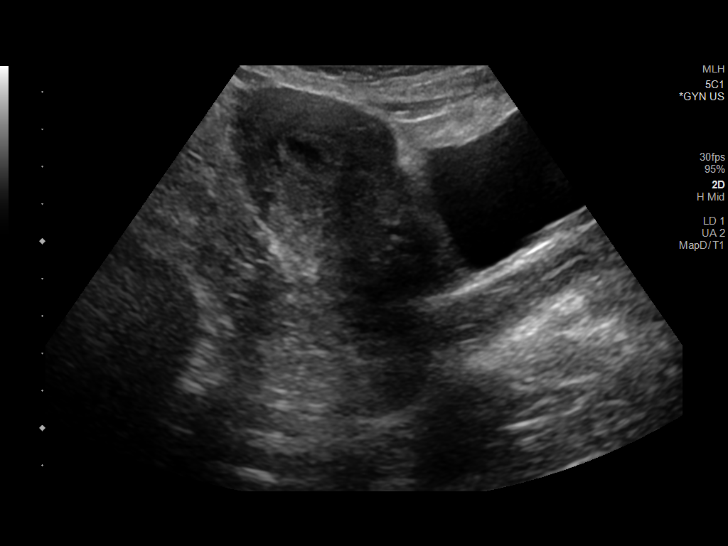
[im 24/141]
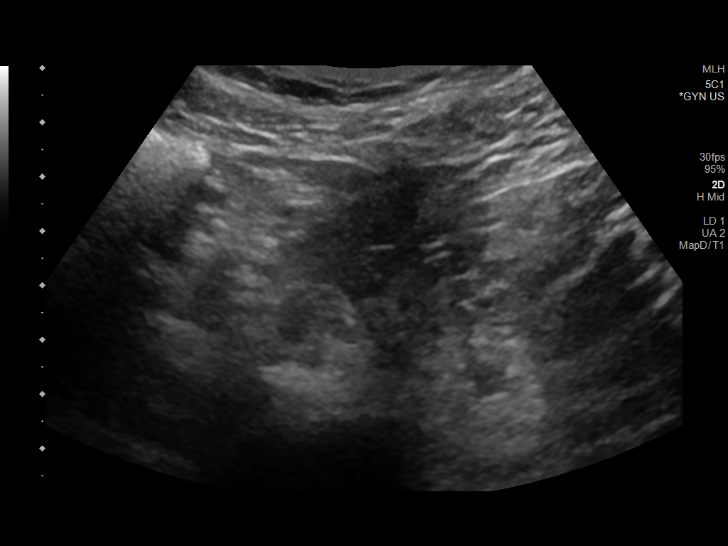
[im 36/141]
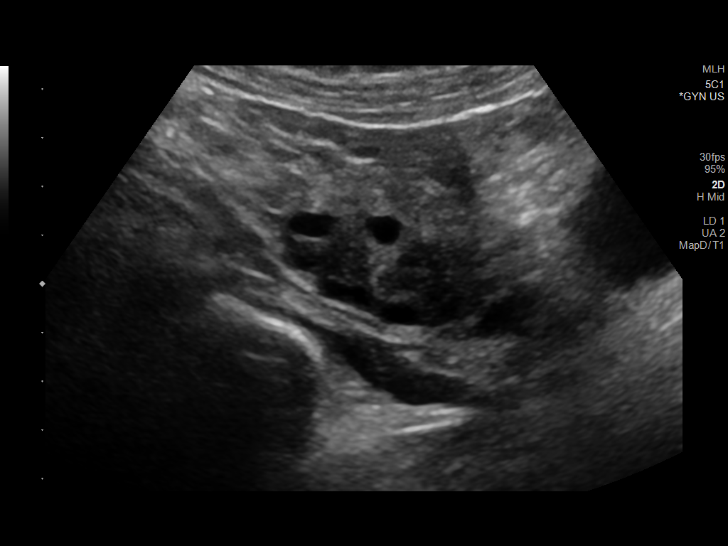
[im 47/141]
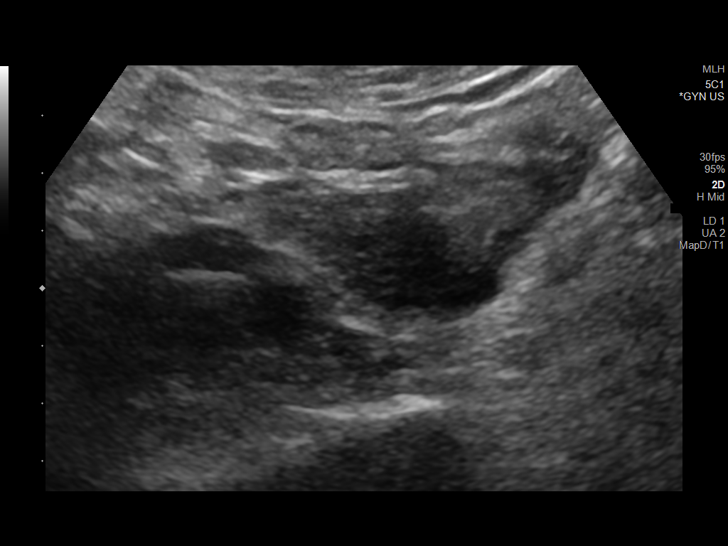
[im 59/141]
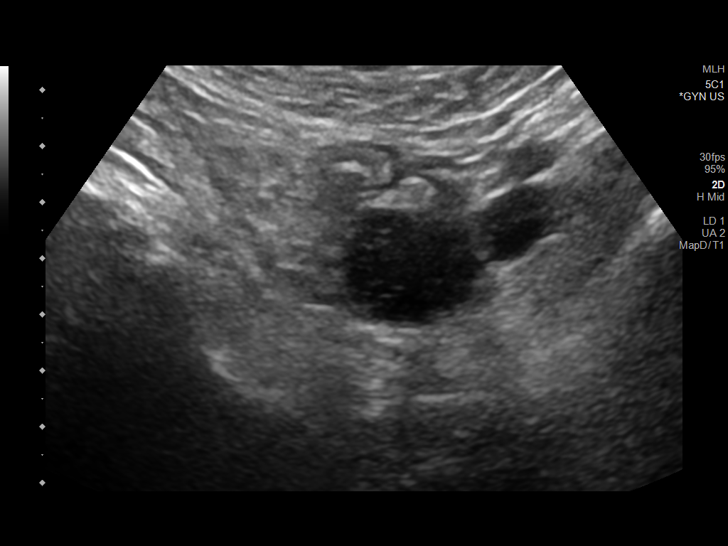
[im 71/141]
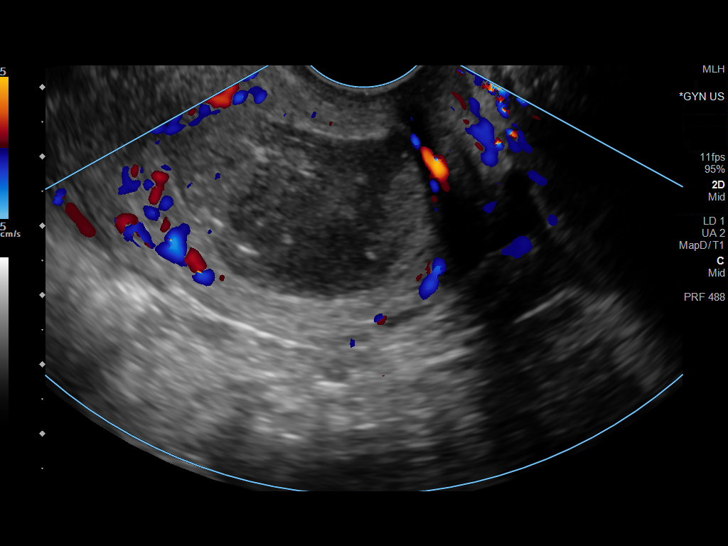
[im 82/141]
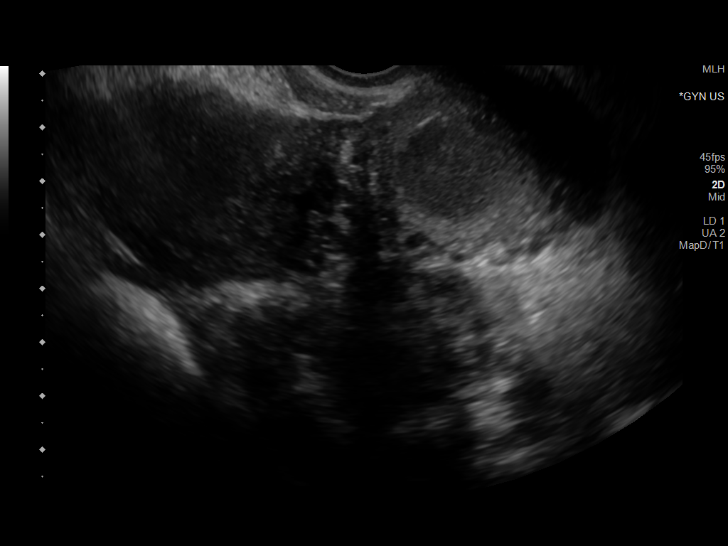
[im 94/141]
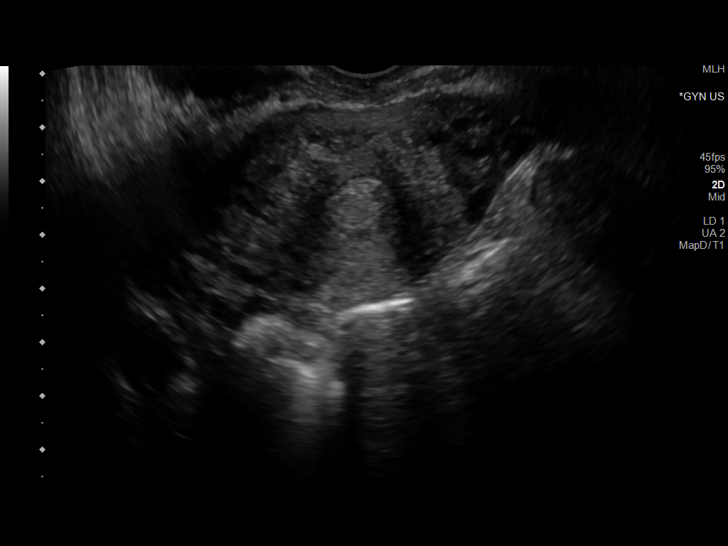
[im 106/141]
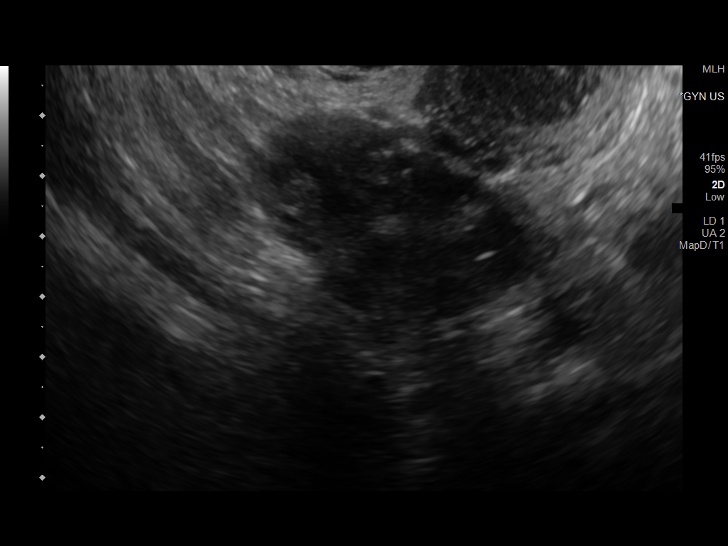
[im 117/141]
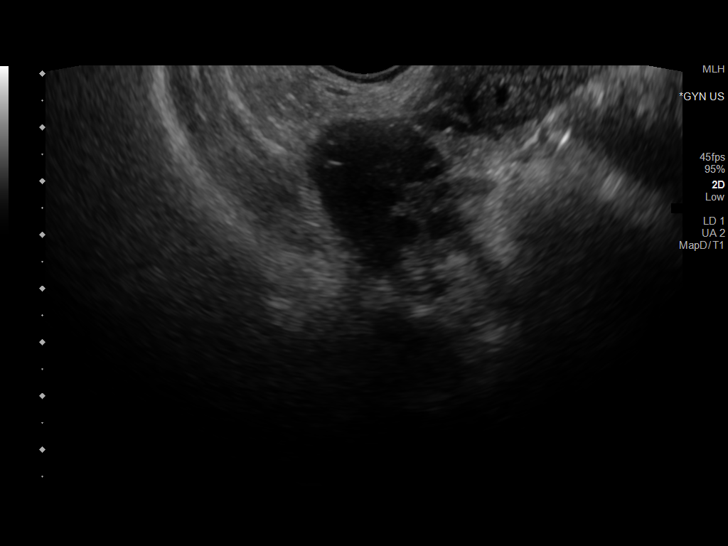
[im 129/141]
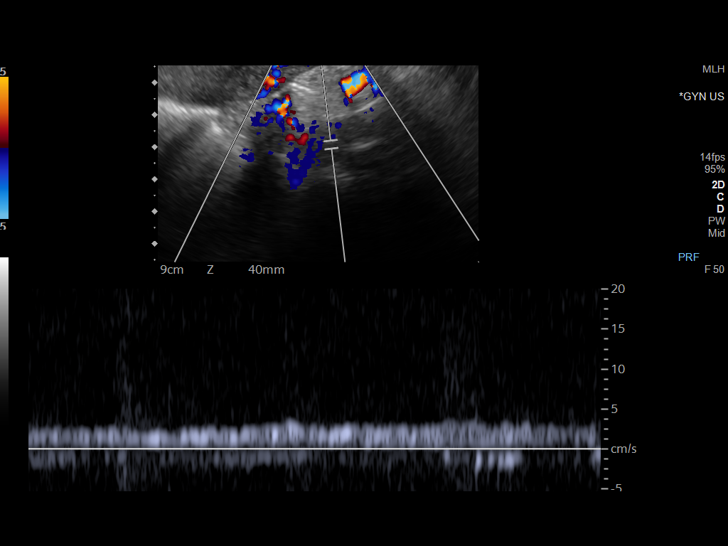
[im 141/141]
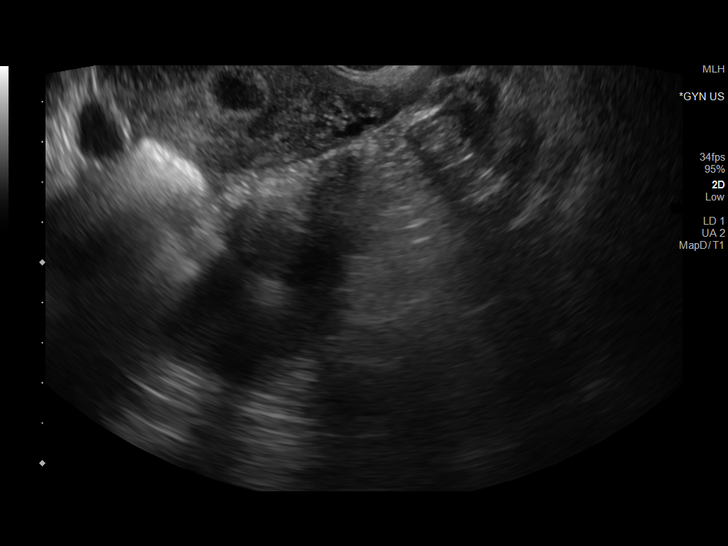

[13 of 25 positions shown; findings below may reference images not displayed]

FINDINGS: Uterus

Measurements: 9.8 x 4.7 x 6.2 cm = volume: 148 mL. No fibroids or
other mass visualized.

Endometrium

Thickness: 4.3 mm. The endometrial appears to be filled with
heterogeneous hyperdense fluid extending into the lower uterine
segment and cervix.

Right ovary

Measurements: 4.8 x 2.4 x 2.6 cm = volume: 16 mL. Normal
appearance/no adnexal mass.

Left ovary

Measurements: 3.4 x 2.3 x 2.4 cm = volume: 10 mL. Normal
appearance/no adnexal mass.

Other findings

No abnormal free fluid.
IMPRESSION: Dilated endometrial canal extending into the lower uterine segment
and cervix with heterogeneous hyperechoic debris/blood products.

Normal appearing ovaries.

## 2021-08-03 ENCOUNTER — Telehealth: Payer: Self-pay

## 2021-08-03 NOTE — Telephone Encounter (Signed)
Pt called office today stating she needs to cancel appointment for Friday 08/05/21. She will call back to reschedule.

## 2021-08-05 ENCOUNTER — Ambulatory Visit: Payer: Medicaid Other | Admitting: Gynecologic Oncology

## 2021-08-05 DIAGNOSIS — C539 Malignant neoplasm of cervix uteri, unspecified: Secondary | ICD-10-CM

## 2021-09-15 DIAGNOSIS — Z0000 Encounter for general adult medical examination without abnormal findings: Secondary | ICD-10-CM | POA: Diagnosis not present

## 2021-09-15 DIAGNOSIS — Z13220 Encounter for screening for lipoid disorders: Secondary | ICD-10-CM | POA: Diagnosis not present

## 2021-09-15 DIAGNOSIS — Z131 Encounter for screening for diabetes mellitus: Secondary | ICD-10-CM | POA: Diagnosis not present

## 2021-09-15 DIAGNOSIS — R002 Palpitations: Secondary | ICD-10-CM | POA: Diagnosis not present

## 2021-09-21 DIAGNOSIS — R1310 Dysphagia, unspecified: Secondary | ICD-10-CM | POA: Diagnosis not present

## 2021-11-02 DIAGNOSIS — C539 Malignant neoplasm of cervix uteri, unspecified: Secondary | ICD-10-CM | POA: Diagnosis not present

## 2021-11-02 DIAGNOSIS — Z6824 Body mass index (BMI) 24.0-24.9, adult: Secondary | ICD-10-CM | POA: Diagnosis not present

## 2021-11-02 DIAGNOSIS — Z01419 Encounter for gynecological examination (general) (routine) without abnormal findings: Secondary | ICD-10-CM | POA: Diagnosis not present

## 2021-11-04 ENCOUNTER — Ambulatory Visit: Payer: BC Managed Care – PPO | Attending: Cardiology | Admitting: Cardiology

## 2021-11-04 ENCOUNTER — Encounter: Payer: Self-pay | Admitting: Cardiology

## 2021-11-04 ENCOUNTER — Ambulatory Visit (INDEPENDENT_AMBULATORY_CARE_PROVIDER_SITE_OTHER): Payer: BC Managed Care – PPO

## 2021-11-04 VITALS — BP 100/80 | HR 70 | Ht 66.0 in | Wt 148.0 lb

## 2021-11-04 DIAGNOSIS — R002 Palpitations: Secondary | ICD-10-CM

## 2021-11-04 NOTE — Patient Instructions (Signed)
Medication Instructions:  The current medical regimen is effective;  continue present plan and medications.  *If you need a refill on your cardiac medications before your next appointment, please call your pharmacy*  Testing/Procedures: [place] Monitor Instructions  Your physician has requested you wear a ZIO patch monitor for 14 days.  This is a single patch monitor. [employer] supplies one patch monitor per enrollment. Additional stickers are not available. Please do not apply patch if you will be having a Nuclear Stress Test,  Echocardiogram, Cardiac CT, MRI, or Chest Xray during the period you would be wearing the  monitor. The patch cannot be worn during these tests. You cannot remove and re-apply the  ZIO XT patch monitor.  Your ZIO patch monitor will be mailed 3 day USPS to your address on file. It may take 3-5 days  to receive your monitor after you have been enrolled.  Once you have received your monitor, please review the enclosed instructions. Your monitor  has already been registered assigning a specific monitor serial # to you.  Billing and Patient Assistance Program Information  We have supplied [employer] with any of your insurance information on file for billing purposes. [employer] offers a sliding scale Patient Assistance Program for patients that do not have  insurance, or whose insurance does not completely cover the cost of the ZIO monitor.  You must apply for the Patient Assistance Program to qualify for this discounted rate.  To apply, please call [employer] at [phone], select option 4, select option 2, ask to apply for  Patient Assistance Program. Cassandra Watts will ask your household income, and how many people  are in your household. They will quote your out-of-pocket cost based on that information.  [employer] will also be able to set up a 24-month interest-free payment plan if needed.  Applying the monitor   Shave hair from upper left chest.  Hold abrader disc  by orange tab. Rub abrader in 40 strokes over the upper left chest as  indicated in your monitor instructions.  Clean area with 4 enclosed alcohol pads. Let dry.  Apply patch as indicated in monitor instructions. Patch will be placed under collarbone on left  side of chest with arrow pointing upward.  Rub patch adhesive wings for 2 minutes. Remove white label marked "1". Remove the white  label marked "2". Rub patch adhesive wings for 2 additional minutes.  While looking in a mirror, press and release button in center of patch. A small green light will  flash 3-4 times. This will be your only indicator that the monitor has been turned on.  Do not shower for the first 24 hours. You may shower after the first 24 hours.  Press the button if you feel a symptom. You will hear a small click. Record Date, Time and  Symptom in the Patient Logbook.  When you are ready to remove the patch, follow instructions on the last 2 pages of Patient  Logbook. Stick patch monitor onto the last page of Patient Logbook.  Place Patient Logbook in the blue and white box. Use locking tab on box and tape box closed  securely. The blue and white box has prepaid postage on it. Please place it in the mailbox as  soon as possible. Your physician should have your test results approximately 7 days after the  monitor has been mailed back to [facility]  Call [facility] [phone] you have questions regarding  your ZIO XT patch  monitor. Call them immediately if you see an orange light blinking on your  monitor.  If your monitor falls off in less than 4 days, contact our Monitor department at [phone].  If your monitor becomes loose or falls off after 4 days call [employer] at [phone] for  suggestions on securing your monitor    Follow-Up: At [facility], you and your health needs are our priority.  As part of our continuing mission to provide you with exceptional heart care,  we have created designated Provider Care Teams.  These Care Teams include your primary Cardiologist (physician) and Advanced Practice Providers (APPs -  Physician Assistants and Nurse Practitioners) who all work together to provide you with the care you need, when you need it.  We recommend signing up for the patient portal called "MyChart".  Sign up information is provided on this After Visit Summary.  MyChart is used to connect with patients for Virtual Visits (Telemedicine).  Patients are able to view lab/test results, encounter notes, upcoming appointments, etc.  Non-urgent messages can be sent to your provider as well.   To learn more about what you can do with MyChart, go to [email or web address].    Your next appointment:   Follow up will be based on the results of the above testing.    Important Information About Sugar

## 2021-11-04 NOTE — Progress Notes (Unsigned)
ZIO XT 14 day monitor placed on patient In-Office by Kelli Churn, RN.

## 2021-11-04 NOTE — Progress Notes (Signed)
Cardiology Office Note:    Date:  11/04/2021   ID:  Cassandra Watts, DOB 01/17/1991, MRN [id number]  PCP:  Caren Macadam, MD   [facility] Providers Cardiologist:  None     Referring MD: Caren Macadam, MD   History of Present Illness:    Cassandra Watts is a 31 y.o. female here for the evaluation of of palpitations at the request of Dr. Caren Macadam.   She met with Dr. Mannie Stabile in 09/20/2021 where she presented with palpitations sporadically during her intermittent religous fasting. It was noted she had a family history of cardiovascular issues and atrial fibrillation. After discussion she wanted to pursue reevaluation by cardiology.   Today: She states that the palpitations started in February when she was fasting for 3 days for religous purposes. She completes this once a month and only drinks water during the fasting. On the 3rd night in February, she had a 30 minute episode of rapid palpitations. She broke her fast, drank water, and lied down. She continued to experience further episodes for 1 month intermittently associated with severe fatigue.   She reports that on the second day in either June or July, while she was fasting again, she again felt her heart start racing. She often wakes up in the morning and feels her heart skipping. She notes that she also feels her heart racing when she picks up her 33 year old. It happens mostly at night, about 75% and some do happen in the day. She reports that she tries to calm herself down and does some breathing techniques that help with calming her heart. Of note, she admits to the intake of coffee during fasting (1 cup).    She is not a smoker, and does not take any supplements.  In her family, her mother has arrhythmias, where her heart pauses for a brief second.   She  denies any chest pain, shortness of breath, or peripheral edema. No lightheadedness, headaches, syncope, orthopnea, or PND.    Past Medical History:  Diagnosis Date   Breast  fibroadenoma    Chest pain    Dermatitis    Difficulty swallowing solids    Hematometra    08-22-2019 per pt no menses since cervial conization surgery 06-04-2019, stated told her cervix is closed due to stenosis, she having abd. 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 08-14-2019     Tension headache     Past Surgical History:  Procedure Laterality Date   BREAST BIOPSY Right 2013   benign growth   CERVICAL BIOPSY  W/ LOOP ELECTRODE EXCISION  01-10-2019   sedation in  GYN office   CERVICAL CONIZATION W/BX  06-04-2019  [facility]  in [place] N/A 08/27/2019   Procedure: DILATATION AND CURETTAGE OF UTERUS;  Surgeon: Lafonda Mosses, MD;  Location: [facility];  Service: Gynecology;  Laterality: N/A;    Current Medications: No outpatient medications have been marked as taking for the 11/04/21 encounter (  Office Visit) with Jerline Pain, MD.     Allergies:   Oxycodone and Penicillins   Social History   Socioeconomic History   Marital status: Married    Spouse name: Not on file   Number of children: Not on file   Years of education: Not on file   Highest education level: Not on file  Occupational History   Not on file  Tobacco Use   Smoking status: Former    Packs/day: 1.00    Years: 1.00    Total pack years: 1.00    Types: Cigarettes    Quit date: 08/22/2010    Years since quitting: 11.2   Smokeless tobacco: Never  Vaping Use   Vaping Use: Former   Quit date:  08/22/2011  Substance and Sexual Activity   Alcohol use: Not Currently   Drug use: Never   Sexual activity: Not on file  Other Topics Concern   Not on file  Social History Narrative   Not on file   Social Determinants of Health   Financial Resource Strain: Not on file  Food Insecurity: Not on file  Transportation Needs: Not on file  Physical Activity: Not on file  Stress: Not on file  Social Connections: Not on file     Family History: The patient's family history includes CVA in her mother; Cancer in her maternal aunt, mother, and sister; Diabetes in her mother; Heart attack in her paternal grandmother; Hypertension in her mother.  ROS:   Please see the history of present illness.    (+) Palpitations (+) Fatigue/Malaise All other systems reviewed and are negative.  EKGs/Labs/Other Studies Reviewed:    The following studies were reviewed today:  Monitor 08/26/2019:   There were 5 episodes of second-degree AV block (2:1 block) lasting total of 16 seconds. Episodes occurred during the night, no symptoms reported.   14 days of data recorded on Zio monitor. Patient had a min HR of 28 bpm, max HR of 175 bpm, and avg HR of 68 bpm. Predominant underlying rhythm was Sinus Rhythm. No VT, SVT, or atrial fibrillation noted.  There were 5 episodes of second-degree AV block (2-1 block) lasting total of 16 seconds.  Episodes occurred during the night, no symptoms reported.  Isolated atrial and ventricular ectopy was rare (<1%). There were 12 triggered events, which corresponded to normal sinus rhythm.  Echo 08/14/2019:   IMPRESSIONS     1. Left ventricular ejection fraction, by estimation, is 60 to 65%. The  left ventricle has normal function. The left ventricle has no regional  wall motion abnormalities. Left ventricular diastolic parameters were  normal.   2. Right ventricular systolic function is normal. The right ventricular  size is normal. Tricuspid regurgitation signal is  inadequate for assessing  PA pressure.   3. The mitral valve is normal in structure. Trivial mitral valve  regurgitation. No evidence of mitral stenosis.   4. The aortic valve is tricuspid. Aortic valve regurgitation is not  visualized. No aortic stenosis is present.   5. The inferior vena cava is normal in size with greater than 50%  respiratory variability, suggesting right atrial pressure of 3 mmHg.   PET CT Chest 02/28/2019 ([facility]):  IMPRESSION:  1.  No evidence of metastatic disease.  2.  Focal activity within the anterior mediastinum on FDG-PET. This could  reflect variant physiologic thymic activity, but MRI recommended to exclude  thymic neoplasm.    EKG:  EKG is personally reviewed and interpreted. 11/04/2021: Normal  sinus rhythm.   Recent Labs: No results found for requested labs within last 365 days.   Recent Lipid Panel No results found for: "CHOL", "TRIG", "HDL", "CHOLHDL", "VLDL", "LDLCALC", "LDLDIRECT"   Risk Assessment/Calculations:          Physical Exam:    VS:  BP 100/80 (BP Location: Left Arm, Patient Position: Sitting, Cuff Size: Normal)   Pulse 70   Ht $R'5\' 6"'yw$  (1.676 m)   Wt 148 lb (67.1 kg)   SpO2 98%   BMI 23.89 kg/m     Wt Readings from Last 3 Encounters:  11/04/21 148 lb (67.1 kg)  12/22/20 137 lb 8 oz (62.4 kg)  12/19/19 133 lb (60.3 kg)     GEN: Well nourished, well developed in no acute distress HEENT: Normal NECK: No JVD; No carotid bruits LYMPHATICS: No lymphadenopathy CARDIAC: RRR, no murmurs, rubs, gallops RESPIRATORY:  Clear to auscultation without rales, wheezing or rhonchi  ABDOMEN: Soft, non-tender, non-distended MUSCULOSKELETAL:  No edema; No deformity  SKIN: Warm and dry NEUROLOGIC:  Alert and oriented x 3 PSYCHIATRIC:  Normal affect   ASSESSMENT:    1. Palpitations    PLAN:    In order of problems listed above:  Palpitations - We will go ahead and place a Zio patch monitor for further evaluation.  These tacky  arrhythmia seem to be occurring during her first of the month fasts that occur for 3 days at the beginning of each month.  She also states that she has been drinking caffeine during some of these and this could be potentiating arrhythmias as well.  I want to make sure that there are no adverse arrhythmias present.  I did promote the possibility of magnesium supplementation during fasting.  Otherwise discussed with her tactics to help relieve the tachycardia: breathing, Valsalva type maneuvers if necessary.  Discussed with her times when ER makes sense and when it does not.       Follow-up:  TBD based on results of testing.  Medication Adjustments/Labs and Tests Ordered: Current medicines are reviewed at length with the patient today.  Concerns regarding medicines are outlined above.   Orders Placed This Encounter  Procedures   LONG TERM MONITOR (3-14 DAYS)   EKG 12-Lead   No orders of the defined types were placed in this encounter.  Patient Instructions  Medication Instructions:  The current medical regimen is effective;  continue present plan and medications.  *If you need a refill on your cardiac medications before your next appointment, please call your pharmacy*  Testing/Procedures: [place] Monitor Instructions  Your physician has requested you wear a ZIO patch monitor for 14 days.  This is a single patch monitor. [employer] supplies one patch monitor per enrollment. Additional stickers are not available. Please do not apply patch if you will be having a Nuclear Stress Test,  Echocardiogram, Cardiac CT, MRI, or Chest Xray during the period you would be wearing the  monitor. The patch cannot be worn during these tests. You cannot remove and re-apply the  ZIO XT patch monitor.  Your ZIO patch monitor will be mailed 3 day USPS to your address on file. It may take 3-5 days  to receive your monitor after you have been enrolled.  Once you have received your monitor, please  review the enclosed instructions. Your monitor  has already been registered assigning a specific monitor serial # to you.  Billing and Patient Assistance Program Information  We have supplied [employer] with any  of your insurance information on file for billing purposes. [employer] offers a sliding scale Patient Assistance Program for patients that do not have  insurance, or whose insurance does not completely cover the cost of the ZIO monitor.  You must apply for the Patient Assistance Program to qualify for this discounted rate.  To apply, please call [employer] at [phone], select option 4, select option 2, ask to apply for  Patient Assistance Program. Theodore Demark will ask your household income, and how many people  are in your household. They will quote your out-of-pocket cost based on that information.  [employer] will also be able to set up a 33-month, interest-free payment plan if needed.  Applying the monitor   Shave hair from upper left chest.  Hold abrader disc by orange tab. Rub abrader in 40 strokes over the upper left chest as  indicated in your monitor instructions.  Clean area with 4 enclosed alcohol pads. Let dry.  Apply patch as indicated in monitor instructions. Patch will be placed under collarbone on left  side of chest with arrow pointing upward.  Rub patch adhesive wings for 2 minutes. Remove white label marked "1". Remove the white  label marked "2". Rub patch adhesive wings for 2 additional minutes.  While looking in a mirror, press and release button in center of patch. A small green light will  flash 3-4 times. This will be your only indicator that the monitor has been turned on.  Do not shower for the first 24 hours. You may shower after the first 24 hours.  Press the button if you feel a symptom. You will hear a small click. Record Date, Time and  Symptom in the Patient Logbook.  When you are ready to remove the patch, follow instructions on the last 2 pages of Patient   Logbook. Stick patch monitor onto the last page of Patient Logbook.  Place Patient Logbook in the blue and white box. Use locking tab on box and tape box closed  securely. The blue and white box has prepaid postage on it. Please place it in the mailbox as  soon as possible. Your physician should have your test results approximately 7 days after the  monitor has been mailed back to [facility].  Call [facility] at [phone] if you have questions regarding  your ZIO XT patch monitor. Call them immediately if you see an orange light blinking on your  monitor.  If your monitor falls off in less than 4 days, contact our Monitor department at [phone].  If your monitor becomes loose or falls off after 4 days call [employer] at [phone] for  suggestions on securing your monitor    Follow-Up: At [facility], you and your health needs are our priority.  As part of our continuing mission to provide you with exceptional heart care, we have created designated Provider Care Teams.  These Care Teams include your primary Cardiologist (physician) and Advanced Practice Providers (APPs -  Physician Assistants and Nurse Practitioners) who all work together to provide you with the care you need, when you need it.  We recommend signing up for the patient portal called "MyChart".  Sign up information is provided on this After Visit Summary.  MyChart is used to connect with patients for Virtual Visits (Telemedicine).  Patients are able to view lab/test results, encounter notes, upcoming appointments, etc.  Non-urgent messages can be sent to your provider as well.   To learn more about what you can do  with MyChart, go to [email or web address].    Your next appointment:   Follow up will be based on the results of the above testing.    Important Information About Sugar         I,Mathew Stumpf,acting as a scribe for Candee Furbish, MD.,have documented all relevant  documentation on the behalf of Candee Furbish, MD,as directed by  Candee Furbish, MD while in the presence of Candee Furbish, MD.  I, Candee Furbish, MD, have reviewed all documentation for this visit. The documentation on 11/04/21 for the exam, diagnosis, procedures, and orders are all accurate and complete.   Waynetta Pean  11/04/2021 5:14 PM    [facility]

## 2021-11-17 DIAGNOSIS — R002 Palpitations: Secondary | ICD-10-CM | POA: Diagnosis not present

## 2022-01-14 ENCOUNTER — Ambulatory Visit: Payer: Self-pay
